# Patient Record
Sex: Male | Born: 1945 | Race: White | Hispanic: No | Marital: Married | Smoking: Former smoker
Health system: Southern US, Community
[De-identification: ages and names within clinical notes are randomized; demographics above are authoritative.]

## PROBLEM LIST (undated history)

## (undated) DIAGNOSIS — N182 Chronic kidney disease, stage 2 (mild): Secondary | ICD-10-CM

## (undated) DIAGNOSIS — I359 Nonrheumatic aortic valve disorder, unspecified: Secondary | ICD-10-CM

## (undated) DIAGNOSIS — I509 Heart failure, unspecified: Secondary | ICD-10-CM

## (undated) DIAGNOSIS — E118 Type 2 diabetes mellitus with unspecified complications: Secondary | ICD-10-CM

## (undated) DIAGNOSIS — I10 Essential (primary) hypertension: Secondary | ICD-10-CM

## (undated) DIAGNOSIS — G4733 Obstructive sleep apnea (adult) (pediatric): Secondary | ICD-10-CM

## (undated) HISTORY — PX: AORTA SURGERY: SHX548

## (undated) HISTORY — DX: Heart failure, unspecified: I50.9

## (undated) HISTORY — PX: CARDIAC SURGERY: SHX584

## (undated) HISTORY — PX: APPENDECTOMY: SHX54

---

## 2015-09-27 ENCOUNTER — Encounter: Payer: Self-pay | Admitting: Emergency Medicine

## 2015-09-27 ENCOUNTER — Emergency Department: Payer: Medicare Other

## 2015-09-27 ENCOUNTER — Emergency Department
Admission: EM | Admit: 2015-09-27 | Discharge: 2015-09-27 | Disposition: A | Discharge status: Home / Self Care | Payer: Medicare Other | Attending: Emergency Medicine | Admitting: Emergency Medicine

## 2015-09-27 DIAGNOSIS — R0600 Dyspnea, unspecified: Secondary | ICD-10-CM | POA: Diagnosis not present

## 2015-09-27 DIAGNOSIS — R0602 Shortness of breath: Secondary | ICD-10-CM | POA: Diagnosis not present

## 2015-09-27 DIAGNOSIS — Z7982 Long term (current) use of aspirin: Secondary | ICD-10-CM | POA: Insufficient documentation

## 2015-09-27 DIAGNOSIS — I10 Essential (primary) hypertension: Secondary | ICD-10-CM | POA: Diagnosis not present

## 2015-09-27 DIAGNOSIS — E875 Hyperkalemia: Secondary | ICD-10-CM | POA: Insufficient documentation

## 2015-09-27 DIAGNOSIS — Z794 Long term (current) use of insulin: Secondary | ICD-10-CM | POA: Insufficient documentation

## 2015-09-27 DIAGNOSIS — E119 Type 2 diabetes mellitus without complications: Secondary | ICD-10-CM | POA: Diagnosis not present

## 2015-09-27 DIAGNOSIS — N289 Disorder of kidney and ureter, unspecified: Secondary | ICD-10-CM | POA: Insufficient documentation

## 2015-09-27 DIAGNOSIS — Z79899 Other long term (current) drug therapy: Secondary | ICD-10-CM | POA: Insufficient documentation

## 2015-09-27 DIAGNOSIS — R42 Dizziness and giddiness: Secondary | ICD-10-CM | POA: Diagnosis present

## 2015-09-27 HISTORY — DX: Essential (primary) hypertension: I10

## 2015-09-27 LAB — CBC WITH DIFFERENTIAL/PLATELET
BASOS ABS: 0.1 10*3/uL (ref 0–0.1)
BASOS PCT: 1 %
Eosinophils Absolute: 0.1 10*3/uL (ref 0–0.7)
Eosinophils Relative: 1 %
HEMATOCRIT: 38.2 % — AB (ref 40.0–52.0)
HEMOGLOBIN: 12.9 g/dL — AB (ref 13.0–18.0)
Lymphocytes Relative: 14 %
Lymphs Abs: 1.2 10*3/uL (ref 1.0–3.6)
MCH: 32.2 pg (ref 26.0–34.0)
MCHC: 33.9 g/dL (ref 32.0–36.0)
MCV: 95 fL (ref 80.0–100.0)
MONOS PCT: 9 %
Monocytes Absolute: 0.8 10*3/uL (ref 0.2–1.0)
NEUTROS ABS: 6.7 10*3/uL — AB (ref 1.4–6.5)
NEUTROS PCT: 75 %
Platelets: 160 10*3/uL (ref 150–440)
RBC: 4.02 MIL/uL — AB (ref 4.40–5.90)
RDW: 16.8 % — ABNORMAL HIGH (ref 11.5–14.5)
WBC: 8.8 10*3/uL (ref 3.8–10.6)

## 2015-09-27 LAB — COMPREHENSIVE METABOLIC PANEL
ALBUMIN: 3.9 g/dL (ref 3.5–5.0)
ALK PHOS: 76 U/L (ref 38–126)
ALT: 28 U/L (ref 17–63)
AST: 33 U/L (ref 15–41)
Anion gap: 9 (ref 5–15)
BILIRUBIN TOTAL: 1 mg/dL (ref 0.3–1.2)
BUN: 34 mg/dL — AB (ref 6–20)
CALCIUM: 8.2 mg/dL — AB (ref 8.9–10.3)
CO2: 20 mmol/L — AB (ref 22–32)
Chloride: 103 mmol/L (ref 101–111)
Creatinine, Ser: 1.85 mg/dL — ABNORMAL HIGH (ref 0.61–1.24)
GFR calc Af Amer: 41 mL/min — ABNORMAL LOW (ref >60)
GFR calc non Af Amer: 35 mL/min — ABNORMAL LOW (ref >60)
GLUCOSE: 308 mg/dL — AB (ref 65–99)
Potassium: 5.8 mmol/L — ABNORMAL HIGH (ref 3.5–5.1)
Sodium: 132 mmol/L — ABNORMAL LOW (ref 135–145)
TOTAL PROTEIN: 7.2 g/dL (ref 6.5–8.1)

## 2015-09-27 LAB — BASIC METABOLIC PANEL
Anion gap: 9 (ref 5–15)
BUN: 32 mg/dL — AB (ref 6–20)
CHLORIDE: 103 mmol/L (ref 101–111)
CO2: 21 mmol/L — ABNORMAL LOW (ref 22–32)
CREATININE: 1.78 mg/dL — AB (ref 0.61–1.24)
Calcium: 8 mg/dL — ABNORMAL LOW (ref 8.9–10.3)
GFR, EST AFRICAN AMERICAN: 43 mL/min — AB (ref >60)
GFR, EST NON AFRICAN AMERICAN: 37 mL/min — AB (ref >60)
Glucose, Bld: 200 mg/dL — ABNORMAL HIGH (ref 65–99)
POTASSIUM: 4.6 mmol/L (ref 3.5–5.1)
SODIUM: 133 mmol/L — AB (ref 135–145)

## 2015-09-27 LAB — URINALYSIS COMPLETE WITH MICROSCOPIC (ARMC ONLY)
BACTERIA UA: NONE SEEN
Bilirubin Urine: NEGATIVE
HGB URINE DIPSTICK: NEGATIVE
Ketones, ur: NEGATIVE mg/dL
Leukocytes, UA: NEGATIVE
Nitrite: NEGATIVE
PROTEIN: 30 mg/dL — AB
RBC / HPF: NONE SEEN RBC/hpf (ref 0–5)
SQUAMOUS EPITHELIAL / LPF: NONE SEEN
Specific Gravity, Urine: 1.013 (ref 1.005–1.030)
pH: 5 (ref 5.0–8.0)

## 2015-09-27 LAB — TROPONIN I: Troponin I: <0.03 ng/mL (ref <0.03)

## 2015-09-27 LAB — GLUCOSE, CAPILLARY: GLUCOSE-CAPILLARY: 187 mg/dL — AB (ref 65–99)

## 2015-09-27 MED ORDER — SODIUM CHLORIDE 0.9 % IV SOLN
Freq: Once | INTRAVENOUS | Status: AC
  Administered 2015-09-27: 16:00:00 via INTRAVENOUS

## 2015-09-27 MED ORDER — DEXTROSE 50 % IV SOLN
25.0000 g | Freq: Once | INTRAVENOUS | Status: DC
  Filled 2015-09-27: qty 50

## 2015-09-27 MED ORDER — DEXTROSE 50 % IV SOLN
12.5000 g | Freq: Once | INTRAVENOUS | Status: AC
  Administered 2015-09-27: 12.5 g via INTRAVENOUS

## 2015-09-27 MED ORDER — INSULIN ASPART 100 UNIT/ML ~~LOC~~ SOLN
10.0000 [IU] | Freq: Once | SUBCUTANEOUS | Status: AC
  Administered 2015-09-27: 10 [IU] via INTRAVENOUS
  Filled 2015-09-27: qty 10

## 2015-09-27 MED ORDER — SODIUM CHLORIDE 0.9 % IV SOLN
1000.0000 mL | Freq: Once | INTRAVENOUS | Status: AC
  Administered 2015-09-27: 1000 mL via INTRAVENOUS

## 2015-09-27 NOTE — ED Notes (Signed)
EMS leaving with patient at this time

## 2015-09-27 NOTE — ED Notes (Signed)
Per [facility], patient comes from home with c/o dizziness. Hx of vertigo. Patient got up around 9am and felt dizzy. He describes this dizziness as "much different" than his usual. Patient normally walks with a cane. EMS states HR was 48, CBG 315. EMS states patient got very SOB when walking and turned blue and pale. Patient A&O x4. HR 51. MD at bedside.

## 2015-09-27 NOTE — ED Provider Notes (Signed)
[facility] Emergency Department Provider Note        Time seen: ----------------------------------------- 2:54 PM on 09/27/2015 -----------------------------------------    I have reviewed the triage vital signs and the nursing notes.   HISTORY  Chief Complaint Dizziness    HPI Taylor Stephenson is a 70 y.o. male who presents to ER complaining of dizziness. Patient reports a history of vertigo but this feels very different. Patient states he feels lightheaded and hit Himself to keep himself from falling. He got up around 9 AM, felt dizzy. Normally he walks with a cane but this did not help him today. EMS arrived to find heart rate was in the 40s blood sugar in the 300s. He also reports some shortness of breath with walking around. He denies recent illness but did have diarrhea today.   Past Medical History  Diagnosis Date  . Hypertension   . Diabetes mellitus without complication (HCC)     There are no active problems to display for this patient.   No past surgical history on file.  Allergies Review of patient's allergies indicates no known allergies.  Social History Social History  Substance Use Topics  . Smoking status: None  . Smokeless tobacco: None  . Alcohol Use: None    Review of Systems Constitutional: Negative for fever. Cardiovascular: Negative for chest pain. Respiratory: Negative for shortness of breath. Gastrointestinal: Negative for abdominal pain, Positive for diarrhea Genitourinary: Negative for dysuria. Musculoskeletal: Negative for back pain. Skin: Negative for rash. Neurological: Negative for headaches, Positive for weakness  10-point ROS otherwise negative.  ____________________________________________   PHYSICAL EXAM:  VITAL SIGNS: ED Triage Vitals  Enc Vitals Group     BP 09/27/15 1432 129/65 mmHg     Pulse Rate 09/27/15 1432 52     Resp 09/27/15 1432 17     Temp 09/27/15 1432 97.9 F (36.6 C)     Temp  Source 09/27/15 1432 Oral     SpO2 09/27/15 1432 99 %     Weight 09/27/15 1432 320 lb (145.151 kg)     Height 09/27/15 1432  (1.702 m)     Head Cir --      Peak Flow --      Pain Score --      Pain Loc --      Pain Edu? --      Excl. in GC? --     Constitutional: Alert and oriented. Well appearing and in no distress. Eyes: Conjunctivae are normal. PERRL. Normal extraocular movements. ENT   Head: Normocephalic and atraumatic.   Nose: No congestion/rhinnorhea.   Mouth/Throat: Mucous membranes are moist.   Neck: No stridor. Cardiovascular: Slow rate, regular rhythm. No murmurs, rubs, or gallops. Respiratory: Normal respiratory effort without tachypnea nor retractions. Breath sounds are clear and equal bilaterally. No wheezes/rales/rhonchi. Gastrointestinal: Soft and nontender. Normal bowel sounds Musculoskeletal: Nontender with normal range of motion in all extremities. Mild edema is noted Neurologic:  Normal speech and language. No gross focal neurologic deficits are appreciated.  Skin:  Skin is warm, dry with chronic-appearing somewhat healing ulcerations on both legs below the knees Psychiatric: Mood and affect are normal. Speech and behavior are normal.  ____________________________________________  EKG: Interpreted by me. Sinus rhythm with first-degree AV block, wide QRS, long QT interval. Right bundle branch block.  ____________________________________________  ED COURSE:  Pertinent labs & imaging results that were available during my care of the patient were reviewed by me and considered in my medical decision  making (see chart for details). Patient presents to ER with dizziness and lightheadedness. He will receive IV fluids. I will check basic labs and reevaluate. ____________________________________________    LABS (pertinent positives/negatives)  Labs Reviewed  CBC WITH DIFFERENTIAL/PLATELET - Abnormal; Notable for the following:    RBC 4.02 (*)     Hemoglobin 12.9 (*)    HCT 38.2 (*)    RDW 16.8 (*)    Neutro Abs 6.7 (*)    All other components within normal limits  COMPREHENSIVE METABOLIC PANEL - Abnormal; Notable for the following:    Sodium 132 (*)    Potassium 5.8 (*)    CO2 20 (*)    Glucose, Bld 308 (*)    BUN 34 (*)    Creatinine, Ser 1.85 (*)    Calcium 8.2 (*)    GFR calc non Af Amer 35 (*)    GFR calc Af Amer 41 (*)    All other components within normal limits  URINALYSIS COMPLETEWITH MICROSCOPIC ([facility] ONLY) - Abnormal; Notable for the following:    Color, Urine YELLOW (*)    APPearance CLEAR (*)    Glucose, UA >500 (*)    Protein, ur 30 (*)    All other components within normal limits  BASIC METABOLIC PANEL - Abnormal; Notable for the following:    Sodium 133 (*)    CO2 21 (*)    Glucose, Bld 200 (*)    BUN 32 (*)    Creatinine, Ser 1.78 (*)    Calcium 8.0 (*)    GFR calc non Af Amer 37 (*)    GFR calc Af Amer 43 (*)    All other components within normal limits  GLUCOSE, CAPILLARY - Abnormal; Notable for the following:    Glucose-Capillary 187 (*)    All other components within normal limits  TROPONIN I    RADIOLOGY Images were viewed by me  Chest x-ray IMPRESSION: No active cardiopulmonary disease. Bilateral lower extremity ultrasound IMPRESSION: No evidence of bilateral lower extremity deep venous thrombosis.  ____________________________________________  FINAL ASSESSMENT AND PLAN  Dizziness, Acute renal insufficiency, hyperkalemia, Dyspnea  Plan: Patient with labs and imaging as dictated above. Patient is had improvement in his renal insufficiency and hyperkalemia after saline. He was also given insulin and a half amp of D50. He does need to be observed to make sure this trend continues. I am also concerned about his dyspnea. Ultrasound was negative for DVT, he will possibly need V/Q scanning or CT angiogram of his chest. Family is agreeable to transfer, he has been except and [facility] by Dr. Fredric MareBailey.   Emily FilbertWilliams, Jonathan E, MD   Note: This dictation was prepared with Dragon dictation. Any transcriptional errors that result from this process are unintentional   Emily FilbertJonathan E Williams, MD 09/27/15 1925

## 2017-07-14 ENCOUNTER — Inpatient Hospital Stay (HOSPITAL_COMMUNITY)
Admit: 2017-07-14 | Discharge: 2017-07-14 | Disposition: A | Discharge status: Home / Self Care | Payer: Medicare Other | Attending: Internal Medicine | Admitting: Internal Medicine

## 2017-07-14 ENCOUNTER — Inpatient Hospital Stay
Admission: EM | Admit: 2017-07-14 | Discharge: 2017-07-18 | DRG: 291 | Disposition: A | Discharge status: Home / Self Care | Payer: Medicare Other | Attending: Internal Medicine | Admitting: Internal Medicine

## 2017-07-14 ENCOUNTER — Emergency Department: Payer: Medicare Other

## 2017-07-14 ENCOUNTER — Other Ambulatory Visit: Payer: Self-pay

## 2017-07-14 ENCOUNTER — Encounter: Payer: Self-pay | Admitting: Emergency Medicine

## 2017-07-14 DIAGNOSIS — R0603 Acute respiratory distress: Secondary | ICD-10-CM | POA: Diagnosis not present

## 2017-07-14 DIAGNOSIS — Z794 Long term (current) use of insulin: Secondary | ICD-10-CM

## 2017-07-14 DIAGNOSIS — E1122 Type 2 diabetes mellitus with diabetic chronic kidney disease: Secondary | ICD-10-CM | POA: Diagnosis present

## 2017-07-14 DIAGNOSIS — Z833 Family history of diabetes mellitus: Secondary | ICD-10-CM

## 2017-07-14 DIAGNOSIS — I509 Heart failure, unspecified: Secondary | ICD-10-CM

## 2017-07-14 DIAGNOSIS — D649 Anemia, unspecified: Secondary | ICD-10-CM | POA: Diagnosis present

## 2017-07-14 DIAGNOSIS — Z79899 Other long term (current) drug therapy: Secondary | ICD-10-CM

## 2017-07-14 DIAGNOSIS — Z953 Presence of xenogenic heart valve: Secondary | ICD-10-CM | POA: Diagnosis not present

## 2017-07-14 DIAGNOSIS — Z87891 Personal history of nicotine dependence: Secondary | ICD-10-CM | POA: Diagnosis not present

## 2017-07-14 DIAGNOSIS — Z91040 Latex allergy status: Secondary | ICD-10-CM | POA: Diagnosis not present

## 2017-07-14 DIAGNOSIS — R601 Generalized edema: Secondary | ICD-10-CM | POA: Diagnosis present

## 2017-07-14 DIAGNOSIS — G4733 Obstructive sleep apnea (adult) (pediatric): Secondary | ICD-10-CM | POA: Diagnosis present

## 2017-07-14 DIAGNOSIS — Z7982 Long term (current) use of aspirin: Secondary | ICD-10-CM | POA: Diagnosis not present

## 2017-07-14 DIAGNOSIS — Z9989 Dependence on other enabling machines and devices: Secondary | ICD-10-CM | POA: Diagnosis not present

## 2017-07-14 DIAGNOSIS — I272 Pulmonary hypertension, unspecified: Secondary | ICD-10-CM | POA: Diagnosis present

## 2017-07-14 DIAGNOSIS — N183 Chronic kidney disease, stage 3 (moderate): Secondary | ICD-10-CM | POA: Diagnosis present

## 2017-07-14 DIAGNOSIS — Z6841 Body Mass Index (BMI) 40.0 and over, adult: Secondary | ICD-10-CM

## 2017-07-14 DIAGNOSIS — J449 Chronic obstructive pulmonary disease, unspecified: Secondary | ICD-10-CM | POA: Diagnosis present

## 2017-07-14 DIAGNOSIS — I2510 Atherosclerotic heart disease of native coronary artery without angina pectoris: Secondary | ICD-10-CM | POA: Diagnosis present

## 2017-07-14 DIAGNOSIS — Z952 Presence of prosthetic heart valve: Secondary | ICD-10-CM | POA: Diagnosis not present

## 2017-07-14 DIAGNOSIS — I130 Hypertensive heart and chronic kidney disease with heart failure and stage 1 through stage 4 chronic kidney disease, or unspecified chronic kidney disease: Principal | ICD-10-CM | POA: Diagnosis present

## 2017-07-14 DIAGNOSIS — I5031 Acute diastolic (congestive) heart failure: Secondary | ICD-10-CM | POA: Diagnosis not present

## 2017-07-14 DIAGNOSIS — R0600 Dyspnea, unspecified: Secondary | ICD-10-CM | POA: Diagnosis not present

## 2017-07-14 DIAGNOSIS — I5033 Acute on chronic diastolic (congestive) heart failure: Secondary | ICD-10-CM | POA: Diagnosis present

## 2017-07-14 DIAGNOSIS — Z841 Family history of disorders of kidney and ureter: Secondary | ICD-10-CM

## 2017-07-14 DIAGNOSIS — I351 Nonrheumatic aortic (valve) insufficiency: Secondary | ICD-10-CM

## 2017-07-14 HISTORY — DX: Nonrheumatic aortic valve disorder, unspecified: I35.9

## 2017-07-14 HISTORY — DX: Morbid (severe) obesity due to excess calories: E66.01

## 2017-07-14 HISTORY — DX: Type 2 diabetes mellitus with unspecified complications: E11.8

## 2017-07-14 HISTORY — DX: Obstructive sleep apnea (adult) (pediatric): G47.33

## 2017-07-14 HISTORY — DX: Chronic kidney disease, stage 2 (mild): N18.2

## 2017-07-14 LAB — CBC WITH DIFFERENTIAL/PLATELET
BASOS ABS: 0 10*3/uL (ref 0–0.1)
BASOS PCT: 1 %
EOS ABS: 0.1 10*3/uL (ref 0–0.7)
Eosinophils Relative: 2 %
HCT: 34.1 % — ABNORMAL LOW (ref 40.0–52.0)
HEMOGLOBIN: 11.1 g/dL — AB (ref 13.0–18.0)
Lymphocytes Relative: 17 %
Lymphs Abs: 1.1 10*3/uL (ref 1.0–3.6)
MCH: 31.7 pg (ref 26.0–34.0)
MCHC: 32.7 g/dL (ref 32.0–36.0)
MCV: 97 fL (ref 80.0–100.0)
MONOS PCT: 11 %
Monocytes Absolute: 0.7 10*3/uL (ref 0.2–1.0)
NEUTROS PCT: 69 %
Neutro Abs: 4.5 10*3/uL (ref 1.4–6.5)
Platelets: 187 10*3/uL (ref 150–440)
RBC: 3.51 MIL/uL — ABNORMAL LOW (ref 4.40–5.90)
RDW: 17.9 % — AB (ref 11.5–14.5)
WBC: 6.4 10*3/uL (ref 3.8–10.6)

## 2017-07-14 LAB — TROPONIN I

## 2017-07-14 LAB — HEMOGLOBIN A1C
HEMOGLOBIN A1C: 7.5 % — AB (ref 4.8–5.6)
Mean Plasma Glucose: 168.55 mg/dL

## 2017-07-14 LAB — BASIC METABOLIC PANEL
Anion gap: 6 (ref 5–15)
BUN: 29 mg/dL — ABNORMAL HIGH (ref 6–20)
CALCIUM: 8.5 mg/dL — AB (ref 8.9–10.3)
CO2: 29 mmol/L (ref 22–32)
CREATININE: 1.4 mg/dL — AB (ref 0.61–1.24)
Chloride: 106 mmol/L (ref 101–111)
GFR calc Af Amer: 56 mL/min — ABNORMAL LOW (ref >60)
GFR, EST NON AFRICAN AMERICAN: 49 mL/min — AB (ref >60)
Glucose, Bld: 167 mg/dL — ABNORMAL HIGH (ref 65–99)
Potassium: 4.9 mmol/L (ref 3.5–5.1)
SODIUM: 141 mmol/L (ref 135–145)

## 2017-07-14 LAB — GLUCOSE, CAPILLARY
Glucose-Capillary: 128 mg/dL — ABNORMAL HIGH (ref 65–99)
Glucose-Capillary: 147 mg/dL — ABNORMAL HIGH (ref 65–99)

## 2017-07-14 LAB — BRAIN NATRIURETIC PEPTIDE: B NATRIURETIC PEPTIDE 5: 519 pg/mL — AB (ref 0.0–100.0)

## 2017-07-14 MED ORDER — RISAQUAD PO CAPS
1.0000 | ORAL_CAPSULE | Freq: Two times a day (BID) | ORAL | Status: DC
  Administered 2017-07-14 – 2017-07-18 (×8): 1 via ORAL
  Filled 2017-07-14 (×9): qty 1

## 2017-07-14 MED ORDER — INSULIN ASPART 100 UNIT/ML ~~LOC~~ SOLN
0.0000 [IU] | Freq: Three times a day (TID) | SUBCUTANEOUS | Status: DC
  Administered 2017-07-15: 1 [IU] via SUBCUTANEOUS
  Administered 2017-07-16: 2 [IU] via SUBCUTANEOUS
  Administered 2017-07-17 (×2): 1 [IU] via SUBCUTANEOUS
  Administered 2017-07-18: 3 [IU] via SUBCUTANEOUS
  Filled 2017-07-14 (×5): qty 1

## 2017-07-14 MED ORDER — ALBUTEROL SULFATE (2.5 MG/3ML) 0.083% IN NEBU: 2.5000 mg | INHALATION_SOLUTION | RESPIRATORY_TRACT | Status: DC | PRN

## 2017-07-14 MED ORDER — INSULIN ASPART 100 UNIT/ML ~~LOC~~ SOLN: 0.0000 [IU] | Freq: Every day | SUBCUTANEOUS | Status: DC

## 2017-07-14 MED ORDER — PERFLUTREN LIPID MICROSPHERE
1.0000 mL | INTRAVENOUS | Status: AC | PRN
  Administered 2017-07-14: 3 mL via INTRAVENOUS
  Filled 2017-07-14: qty 10

## 2017-07-14 MED ORDER — CARVEDILOL 6.25 MG PO TABS
3.1250 mg | ORAL_TABLET | Freq: Two times a day (BID) | ORAL | Status: DC
  Administered 2017-07-14: 3.125 mg via ORAL
  Filled 2017-07-14: qty 1

## 2017-07-14 MED ORDER — HEPARIN SODIUM (PORCINE) 5000 UNIT/ML IJ SOLN
5000.0000 [IU] | Freq: Three times a day (TID) | INTRAMUSCULAR | Status: DC
  Administered 2017-07-14 – 2017-07-18 (×12): 5000 [IU] via SUBCUTANEOUS
  Filled 2017-07-14 (×11): qty 1

## 2017-07-14 MED ORDER — INSULIN ASPART 100 UNIT/ML ~~LOC~~ SOLN
20.0000 [IU] | Freq: Three times a day (TID) | SUBCUTANEOUS | Status: DC | PRN
  Administered 2017-07-15 – 2017-07-17 (×3): 20 [IU] via SUBCUTANEOUS
  Filled 2017-07-14 (×3): qty 1

## 2017-07-14 MED ORDER — SENNOSIDES-DOCUSATE SODIUM 8.6-50 MG PO TABS: 1.0000 | ORAL_TABLET | Freq: Every evening | ORAL | Status: DC | PRN

## 2017-07-14 MED ORDER — ASPIRIN EC 81 MG PO TBEC
81.0000 mg | DELAYED_RELEASE_TABLET | Freq: Every day | ORAL | Status: DC
  Administered 2017-07-15 – 2017-07-18 (×4): 81 mg via ORAL
  Filled 2017-07-14 (×5): qty 1

## 2017-07-14 MED ORDER — ASPIRIN EC 81 MG PO TBEC: 81.0000 mg | DELAYED_RELEASE_TABLET | Freq: Every day | ORAL | Status: DC

## 2017-07-14 MED ORDER — LISINOPRIL 10 MG PO TABS
10.0000 mg | ORAL_TABLET | Freq: Every day | ORAL | Status: DC
  Administered 2017-07-14 – 2017-07-18 (×5): 10 mg via ORAL
  Filled 2017-07-14 (×5): qty 1

## 2017-07-14 MED ORDER — TAMSULOSIN HCL 0.4 MG PO CAPS
0.4000 mg | ORAL_CAPSULE | Freq: Every day | ORAL | Status: DC
  Administered 2017-07-14 – 2017-07-18 (×5): 0.4 mg via ORAL
  Filled 2017-07-14 (×5): qty 1

## 2017-07-14 MED ORDER — SODIUM CHLORIDE 0.9 % IV SOLN
250.0000 mL | INTRAVENOUS | Status: DC | PRN
Start: 2017-07-14 — End: 2017-07-18

## 2017-07-14 MED ORDER — ACETAMINOPHEN 325 MG PO TABS
650.0000 mg | ORAL_TABLET | Freq: Four times a day (QID) | ORAL | Status: DC | PRN
  Administered 2017-07-15 – 2017-07-16 (×2): 650 mg via ORAL
  Filled 2017-07-14 (×2): qty 2

## 2017-07-14 MED ORDER — PANTOPRAZOLE SODIUM 40 MG PO TBEC
40.0000 mg | DELAYED_RELEASE_TABLET | Freq: Every day | ORAL | Status: DC
  Administered 2017-07-15 – 2017-07-18 (×4): 40 mg via ORAL
  Filled 2017-07-14 (×5): qty 1

## 2017-07-14 MED ORDER — SODIUM CHLORIDE 0.9% FLUSH
3.0000 mL | Freq: Two times a day (BID) | INTRAVENOUS | Status: DC
  Administered 2017-07-14 – 2017-07-18 (×9): 3 mL via INTRAVENOUS

## 2017-07-14 MED ORDER — ATORVASTATIN CALCIUM 20 MG PO TABS
40.0000 mg | ORAL_TABLET | Freq: Every day | ORAL | Status: DC
  Administered 2017-07-14 – 2017-07-17 (×4): 40 mg via ORAL
  Filled 2017-07-14 (×4): qty 2

## 2017-07-14 MED ORDER — SERTRALINE HCL 50 MG PO TABS
50.0000 mg | ORAL_TABLET | Freq: Every day | ORAL | Status: DC
  Administered 2017-07-14 – 2017-07-18 (×5): 50 mg via ORAL
  Filled 2017-07-14 (×5): qty 1

## 2017-07-14 MED ORDER — ACETAMINOPHEN 650 MG RE SUPP: 650.0000 mg | Freq: Four times a day (QID) | RECTAL | Status: DC | PRN

## 2017-07-14 MED ORDER — INSULIN GLARGINE 100 UNIT/ML ~~LOC~~ SOLN
50.0000 [IU] | Freq: Two times a day (BID) | SUBCUTANEOUS | Status: DC
  Administered 2017-07-14 – 2017-07-18 (×8): 50 [IU] via SUBCUTANEOUS
  Filled 2017-07-14 (×12): qty 0.5

## 2017-07-14 MED ORDER — HYDROCODONE-ACETAMINOPHEN 5-325 MG PO TABS: 1.0000 | ORAL_TABLET | ORAL | Status: DC | PRN

## 2017-07-14 MED ORDER — FUROSEMIDE 10 MG/ML IJ SOLN
60.0000 mg | Freq: Two times a day (BID) | INTRAMUSCULAR | Status: DC
  Administered 2017-07-14 – 2017-07-15 (×2): 60 mg via INTRAVENOUS
  Filled 2017-07-14 (×2): qty 6

## 2017-07-14 MED ORDER — ONDANSETRON HCL 4 MG/2ML IJ SOLN: 4.0000 mg | Freq: Four times a day (QID) | INTRAMUSCULAR | Status: DC | PRN

## 2017-07-14 MED ORDER — FUROSEMIDE 10 MG/ML IJ SOLN
80.0000 mg | Freq: Once | INTRAMUSCULAR | Status: AC
  Administered 2017-07-14: 80 mg via INTRAVENOUS
  Filled 2017-07-14: qty 8

## 2017-07-14 MED ORDER — SODIUM CHLORIDE 0.9% FLUSH
3.0000 mL | INTRAVENOUS | Status: DC | PRN
  Administered 2017-07-15 – 2017-07-16 (×2): 3 mL via INTRAVENOUS
  Filled 2017-07-14 (×2): qty 3

## 2017-07-14 MED ORDER — BISACODYL 5 MG PO TBEC: 5.0000 mg | DELAYED_RELEASE_TABLET | Freq: Every day | ORAL | Status: DC | PRN

## 2017-07-14 MED ORDER — ONDANSETRON HCL 4 MG PO TABS: 4.0000 mg | ORAL_TABLET | Freq: Four times a day (QID) | ORAL | Status: DC | PRN

## 2017-07-14 NOTE — ED Notes (Signed)
Wife (tam) contacted at [phone] to inform her of pt transfer to [facility].

## 2017-07-14 NOTE — Progress Notes (Signed)
Patient will have wife take home his medications.

## 2017-07-14 NOTE — ED Notes (Signed)
Pt given diet [place] sandwich tray by RN

## 2017-07-14 NOTE — ED Provider Notes (Signed)
[facility] Emergency Department Provider Note       Time seen: ----------------------------------------- 7:56 AM on 07/14/2017 -----------------------------------------   I have reviewed the triage vital signs and the nursing notes.  HISTORY   Chief Complaint Shortness of Breath    HPI Taylor Stephenson is a 72 y.o. male with a history of diabetes and hypertension who presents to the ED for shortness of breath.  Patient reports shortness of breath and weight gain over the past several days.  Patient describes abdominal distention and peripheral edema.  He feels like he has gained perhaps 20 pounds recently but he cannot give an exact start date.  He denies fevers, chills, chest pain but does have shortness of breath.  He denies vomiting or diarrhea.  Patient did receive DuoNeb's and steroids in route with some improvement in his symptoms.  Past Medical History:  Diagnosis Date  . Diabetes mellitus without complication (HCC)   . Hypertension   COPD, pulmonary hypertension  There are no active problems to display for this patient.   Past Surgical History:  Procedure Laterality Date  . AORTA SURGERY    . APPENDECTOMY      Allergies Patient has no known allergies.  Social History Social History   Tobacco Use  . Smoking status: Not on file  Substance Use Topics  . Alcohol use: Not on file  . Drug use: Not on file   Review of Systems Constitutional: Negative for fever. Cardiovascular: Negative for chest pain. Respiratory: Positive for shortness of breath Gastrointestinal: Negative for abdominal pain, vomiting and diarrhea. Musculoskeletal: Negative for back pain.  Positive for peripheral edema Skin: Negative for rash. Neurological: Negative for headaches, focal weakness or numbness.  All systems negative/normal/unremarkable except as stated in the HPI  ____________________________________________   PHYSICAL EXAM:  VITAL SIGNS: ED  Triage Vitals  Enc Vitals Group     BP      Pulse      Resp      Temp      Temp src      SpO2      Weight      Height      Head Circumference      Peak Flow      Pain Score      Pain Loc      Pain Edu?      Excl. in GC?    Constitutional: Alert and oriented.  Mild distress, morbidly obese Eyes: Conjunctivae are normal. Normal extraocular movements. ENT   Head: Normocephalic and atraumatic.   Nose: No congestion/rhinnorhea.   Mouth/Throat: Mucous membranes are moist.   Neck: No stridor. Cardiovascular: Normal rate, regular rhythm. No murmurs, rubs, or gallops. Respiratory: Normal respiratory effort without tachypnea nor retractions. Breath sounds are clear and equal bilaterally. No wheezes/rales/rhonchi. Gastrointestinal: Soft and nontender. Normal bowel sounds Musculoskeletal: Nontender with normal range of motion in extremities.  Generalized edema is noted Neurologic:  Normal speech and language. No gross focal neurologic deficits are appreciated.  Skin:  Skin is warm, dry and intact.  Scattered contusions are appreciated over the trunk and extremities Psychiatric: Mood and affect are normal. Speech and behavior are normal.  ____________________________________________  EKG: Interpreted by me.  Sinus rhythm rate 65 bpm, right bundle branch block pattern, normal PR interval, borderline long QT  ____________________________________________  ED COURSE:  As part of my medical decision making, I reviewed the following data within the electronic MEDICAL RECORD NUMBER History obtained from family if available, nursing  notes, old chart and ekg, as well as notes from prior ED visits. Patient presented for shortness of breath, we will assess with labs and imaging as indicated at this time.   Procedures ____________________________________________   LABS (pertinent positives/negatives)  Labs Reviewed  CBC WITH DIFFERENTIAL/PLATELET - Abnormal; Notable for the following  components:      Result Value   RBC 3.51 (*)    Hemoglobin 11.1 (*)    HCT 34.1 (*)    RDW 17.9 (*)    All other components within normal limits  BASIC METABOLIC PANEL - Abnormal; Notable for the following components:   Glucose, Bld 167 (*)    BUN 29 (*)    Creatinine, Ser 1.40 (*)    Calcium 8.5 (*)    GFR calc non Af Amer 49 (*)    GFR calc Af Amer 56 (*)    All other components within normal limits  BRAIN NATRIURETIC PEPTIDE - Abnormal; Notable for the following components:   B Natriuretic Peptide 519.0 (*)    All other components within normal limits  TROPONIN I    RADIOLOGY  Chest x-ray IMPRESSION: Stable postoperative findings.  Stable cardiomegaly with vascular congestion  No interval change. ____________________________________________  DIFFERENTIAL DIAGNOSIS   CHF, COPD, pneumonia, pulmonary hypertension, anasarca  FINAL ASSESSMENT AND PLAN  Dyspnea, likely CHF exacerbation  Plan: The patient had presented for worsening dyspnea, especially on exertion. Patient's labs revealed elevated BNP but otherwise unremarkable lab work with mild chronic renal insufficiency. Patient's imaging revealed cardiomegaly with vascular congestion.  Patient describes a 20 pound weight gain, he will benefit from aggressive diuresis.  Have started him on IV Lasix.  I will discuss with the hospitalist for admission.   Ulice Dash, MD   Note: This note was generated in part or whole with voice recognition software. Voice recognition is usually quite accurate but there are transcription errors that can and very often do occur. I apologize for any typographical errors that were not detected and corrected.     Emily Filbert, MD 07/14/17 [phone]

## 2017-07-14 NOTE — H&P (Addendum)
[facility] [place] [facility]   PATIENT NAME: Taylor Stephenson    MR#:  [id number]  DATE OF BIRTH:  Feb 19, 1946  DATE OF ADMISSION:  07/14/2017  PRIMARY CARE PHYSICIAN: [facility]   REQUESTING/REFERRING PHYSICIAN: Dr. Mayford Knife.  CHIEF COMPLAINT:   Chief Complaint  Patient presents with  . Shortness of Breath   Worsening shortness of breath for 1 week. HISTORY OF PRESENT ILLNESS:  Taylor Stephenson  is a 72 y.o. male with a known history of hypertension, diabetes, OSA, CKD and aortic wall disease.  The patient presented to ED with above chief complaints.  He also complains of shortness of breath on mild exertion, orthopnea, nocturnal dyspnea, worsening leg edema and weight gain.  Chest x-ray show pulmonary congestion.  PAST MEDICAL HISTORY:   Past Medical History:  Diagnosis Date  . Aortic valve disease   . CKD (chronic kidney disease)   . Diabetes mellitus without complication (HCC)   . Hypertension   . OSA (obstructive sleep apnea)   . Pulmonary hypertension (HCC)     PAST SURGICAL HISTORY:   Past Surgical History:  Procedure Laterality Date  . AORTA SURGERY    . APPENDECTOMY    . CARDIAC SURGERY     heart valve replaced    SOCIAL HISTORY:   Social History   Tobacco Use  . Smoking status: Former Smoker    Types: Cigarettes  . Smokeless tobacco: Never Used  Substance Use Topics  . Alcohol use: Not on file    FAMILY HISTORY:   Family History  Problem Relation Age of Onset  . Diabetes Mother   . Diabetes Father   . Kidney disease Father   . Diabetes Sister     DRUG ALLERGIES:   Allergies  Allergen Reactions  . Latex     REVIEW OF SYSTEMS:   Review of Systems  Constitutional: Positive for malaise/fatigue. Negative for chills and fever.  HENT: Negative for sore throat.   Eyes: Negative for blurred vision and double vision.  Respiratory: Positive for cough, sputum production and shortness of breath. Negative  for hemoptysis, wheezing and stridor.   Cardiovascular: Positive for orthopnea and leg swelling. Negative for chest pain and palpitations.  Gastrointestinal: Negative for abdominal pain, blood in stool, diarrhea, melena, nausea and vomiting.  Genitourinary: Negative for dysuria, flank pain and hematuria.  Musculoskeletal: Negative for back pain and joint pain.  Neurological: Negative for dizziness, sensory change, focal weakness, seizures, loss of consciousness, weakness and headaches.  Endo/Heme/Allergies: Negative for polydipsia.  Psychiatric/Behavioral: Negative for depression. The patient is not nervous/anxious.     MEDICATIONS AT HOME:   Prior to Admission medications   Medication Sig Start Date End Date Taking? Authorizing Provider  acetaminophen (TYLENOL) 500 MG tablet Take 500-1,000 mg by mouth every 6 (six) hours as needed.    [provider]  aspirin EC 81 MG tablet Take 81 mg by mouth daily.    [provider]  insulin glargine (LANTUS) 100 UNIT/ML injection Inject 60 Units into the skin 2 (two) times daily.    [provider]  insulin regular (NOVOLIN R,HUMULIN R) 100 units/mL injection Inject 20 Units into the skin 3 (three) times daily before meals. If blood sugar is over 125.    [provider]  LISINOPRIL PO Take 1 tablet by mouth daily.    [provider]  SERTRALINE HCL PO Take 0.5 tablets by mouth daily.    [provider]  Sildenafil  Citrate (VIAGRA PO) Take 0.5 tablets by mouth 3 (three) times daily.    [provider]  SIMVASTATIN PO Take 0.5 tablets by mouth daily.    [provider]      VITAL SIGNS:  Blood pressure (!) 150/64, pulse 64, temperature 97.9 F (36.6 C), temperature source Oral, resp. rate 15, height  (1.702 m), weight (!) 338 lb (153.3 kg), SpO2 97 %.  PHYSICAL EXAMINATION:  Physical Exam  GENERAL:  72 y.o.-year-old patient lying in the bed with no acute distress.  Morbid  obesity. EYES: Pupils equal, round, reactive to light and accommodation. No scleral icterus. Extraocular muscles intact.  HEENT: Head atraumatic, normocephalic. Oropharynx and nasopharynx clear.  NECK:  Supple, no jugular venous distention. No thyroid enlargement, no tenderness.  LUNGS: No wheezing, bilateral rales,rhonchi or crepitation. No use of accessory muscles of respiration.  CARDIOVASCULAR: S1, S2 normal. No murmurs, rubs, or gallops.  ABDOMEN: Soft, nontender, distended. Bowel sounds present.  Unable to estimate organomegaly or mass.  EXTREMITIES: Bilateral leg and foot edema, no cyanosis, or clubbing.  NEUROLOGIC: Cranial nerves II through XII are intact. Muscle strength 5/5 in all extremities. Sensation intact. Gait not checked.  PSYCHIATRIC: The patient is alert and oriented x 3.  SKIN: No obvious rash, lesion, or ulcer.   LABORATORY PANEL:   CBC Recent Labs  Lab 07/14/17 0800  WBC 6.4  HGB 11.1*  HCT 34.1*  PLT 187   ------------------------------------------------------------------------------------------------------------------  Chemistries  Recent Labs  Lab 07/14/17 0800  NA 141  K 4.9  CL 106  CO2 29  GLUCOSE 167*  BUN 29*  CREATININE 1.40*  CALCIUM 8.5*   ------------------------------------------------------------------------------------------------------------------  Cardiac Enzymes Recent Labs  Lab 07/14/17 0800  TROPONINI <0.03   ------------------------------------------------------------------------------------------------------------------  RADIOLOGY:  Dg Chest 2 View  Result Date: 07/14/2017 CLINICAL DATA:  Shortness of breath, diabetes, hypertension EXAM: CHEST - 2 VIEW COMPARISON:  09/27/2015 FINDINGS: Previous median sternotomy. Aortic valve replacement noted. Stable cardiomegaly with vascular congestion. No superimposed pneumonia, collapse or consolidation. Negative for edema, effusion or pneumothorax. Trachea is midline. Aorta is  atherosclerotic. Degenerative changes of the spine. IMPRESSION: Stable postoperative findings. Stable cardiomegaly with vascular congestion No interval change. Electronically Signed   By: Judie Petit.  Shick M.D.   On: 07/14/2017 09:15      IMPRESSION AND PLAN:   Acute CHF.  Unclear systolic or diastolic. The patient will be admitted to telemetry floor. Start Lasix 60 mg IV every 12 hours, echocardiograph, cardiology consult.  CKD stage III, Stable, follow-up BMP while on Lasix.  Hypertension continue home hypertension medication.  OSA.  Continue home CPAP setting.  Diabetes.  Start sliding scale and continue home Lantus.   All the records are reviewed and case discussed with ED provider. Management plans discussed with the patient, his wife and they are in agreement.  CODE STATUS: Full code  TOTAL TIME TAKING CARE OF THIS PATIENT: 52 minutes.    Shaune Pollack M.D on 07/14/2017 at 10:50 AM  Between 7am to 6pm - Pager - [phone]  After 6pm go to [email or web address] - Social research officer, government  [facility] [place] [facility]  Office  [phone]  CC: Primary care physician; [facility]   Note: This dictation was prepared with Dragon dictation along with smaller phrase technology. Any transcriptional errors that result from this process are unin

## 2017-07-14 NOTE — ED Triage Notes (Signed)
C/O worsening SOB over past several days.  Patient reports symptoms worsening over the past couple of days and this morning.  Patient reports recent rapid weight gain.  States normal weight is just under 300 lbs, but thinks he is around 320 lbs today.  On EMS arrival RA sats 93%.  Received one duoneb and one albuterol treatment by EMS

## 2017-07-14 NOTE — ED Notes (Signed)
Hospitalist to bedside at this time 

## 2017-07-14 NOTE — Progress Notes (Signed)
Advanced Care Plan.  Purpose of Encounter: CODE STATUS. Parties in Attendance: The patient, his wife and me. Patient's Decisional Capacity: Yes. Medical Story: Taylor Stephenson  is a 73 y.o. male with a known history of hypertension, diabetes, OSA, CKD and aortic wall disease.  He has had worsening shortness of breath and leg edema for 1 week.  He is being admitted for acute CHF.  I discussed the patient's current condition, prognosis and CODE STATUS.  The patient want full code.  Plan:  Code Status: Full code Time spent discussing advance care planning: 18 minutes.

## 2017-07-14 NOTE — ED Notes (Signed)
1275 mL urine emptied from cath bag before pt transport

## 2017-07-15 ENCOUNTER — Encounter: Payer: Self-pay | Admitting: Physician Assistant

## 2017-07-15 DIAGNOSIS — R601 Generalized edema: Secondary | ICD-10-CM

## 2017-07-15 DIAGNOSIS — R0603 Acute respiratory distress: Secondary | ICD-10-CM

## 2017-07-15 DIAGNOSIS — I5033 Acute on chronic diastolic (congestive) heart failure: Secondary | ICD-10-CM

## 2017-07-15 DIAGNOSIS — R0600 Dyspnea, unspecified: Secondary | ICD-10-CM

## 2017-07-15 LAB — BASIC METABOLIC PANEL
ANION GAP: 7 (ref 5–15)
BUN: 26 mg/dL — ABNORMAL HIGH (ref 6–20)
CALCIUM: 8.3 mg/dL — AB (ref 8.9–10.3)
CO2: 29 mmol/L (ref 22–32)
Chloride: 102 mmol/L (ref 101–111)
Creatinine, Ser: 1.56 mg/dL — ABNORMAL HIGH (ref 0.61–1.24)
GFR calc Af Amer: 49 mL/min — ABNORMAL LOW (ref >60)
GFR, EST NON AFRICAN AMERICAN: 43 mL/min — AB (ref >60)
Glucose, Bld: 155 mg/dL — ABNORMAL HIGH (ref 65–99)
POTASSIUM: 3.7 mmol/L (ref 3.5–5.1)
SODIUM: 138 mmol/L (ref 135–145)

## 2017-07-15 LAB — CBC
HCT: 32.2 % — ABNORMAL LOW (ref 40.0–52.0)
HEMOGLOBIN: 10.6 g/dL — AB (ref 13.0–18.0)
MCH: 31.4 pg (ref 26.0–34.0)
MCHC: 33.1 g/dL (ref 32.0–36.0)
MCV: 95.1 fL (ref 80.0–100.0)
Platelets: 180 10*3/uL (ref 150–440)
RBC: 3.39 MIL/uL — ABNORMAL LOW (ref 4.40–5.90)
RDW: 16.9 % — AB (ref 11.5–14.5)
WBC: 6 10*3/uL (ref 3.8–10.6)

## 2017-07-15 LAB — GLUCOSE, CAPILLARY
GLUCOSE-CAPILLARY: 136 mg/dL — AB (ref 65–99)
Glucose-Capillary: 111 mg/dL — ABNORMAL HIGH (ref 65–99)
Glucose-Capillary: 106 mg/dL — ABNORMAL HIGH (ref 65–99)
Glucose-Capillary: 182 mg/dL — ABNORMAL HIGH (ref 65–99)

## 2017-07-15 LAB — TROPONIN I
TROPONIN I: 0.03 ng/mL — AB (ref <0.03)
TROPONIN I: 0.03 ng/mL — AB (ref <0.03)

## 2017-07-15 LAB — MAGNESIUM: MAGNESIUM: 1.5 mg/dL — AB (ref 1.7–2.4)

## 2017-07-15 LAB — ECHOCARDIOGRAM COMPLETE
Height: 67 in
WEIGHTICAEL: 5408 [oz_av]

## 2017-07-15 MED ORDER — POTASSIUM CHLORIDE CRYS ER 20 MEQ PO TBCR
20.0000 meq | EXTENDED_RELEASE_TABLET | Freq: Every day | ORAL | Status: DC
  Administered 2017-07-15 – 2017-07-18 (×4): 20 meq via ORAL
  Filled 2017-07-15 (×4): qty 1

## 2017-07-15 MED ORDER — CARVEDILOL 6.25 MG PO TABS
6.2500 mg | ORAL_TABLET | Freq: Two times a day (BID) | ORAL | Status: DC
  Administered 2017-07-15 – 2017-07-18 (×6): 6.25 mg via ORAL
  Filled 2017-07-15 (×6): qty 1

## 2017-07-15 MED ORDER — FUROSEMIDE 10 MG/ML IJ SOLN
40.0000 mg | Freq: Two times a day (BID) | INTRAMUSCULAR | Status: DC
  Administered 2017-07-15 – 2017-07-18 (×6): 40 mg via INTRAVENOUS
  Filled 2017-07-15 (×6): qty 4

## 2017-07-15 MED ORDER — MAGNESIUM SULFATE 2 GM/50ML IV SOLN
2.0000 g | Freq: Once | INTRAVENOUS | Status: AC
  Administered 2017-07-15: 2 g via INTRAVENOUS
  Filled 2017-07-15: qty 50

## 2017-07-15 NOTE — Progress Notes (Signed)
Patient has severe MASD in his abdominal folds and groin folds.  His unable to lift his abdomin to clean in the area.  Site looks and smells like yeast.  Cleansed, dried, Nystatin powder applied. Will instruct wife on care of this site when she returns.

## 2017-07-15 NOTE — Consult Note (Signed)
Cardiology Consultation:   Patient ID: Taylor Stephenson; [id number]; 1945-07-31   Admit date: 07/14/2017 Date of Consult: 07/15/2017  Primary Care Provider: [facility], [facility] Primary Cardiologist: [facility]   Patient Profile:   Taylor Stephenson is a 72 y.o. 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 Imogene Stephenson.  History of Present Illness:   Taylor Stephenson is followed by the [facility]. He reports undergoing bioprosthetic AVR in 2014 with a LHC prior to that showing nonobstructive disease. We do not have any prior cardiac records for him for review. He reports recently being admitted to the [facility] in [place] 2 months prior for similar symptoms. He was diuresed for 2-3 days and discharged. He reports a prior echo, though does not know the results. His home cardiac medications include Lasix 20 mg daily, Coreg 12.5 mg bid, lisinopril 30 mg daily, Lipitor 40 mg daily, and ASA 81 mg daily. He has been compliant with his medications, though did not begin to watch his salt intake until ~ 1 to 1.5 months prior at which time he saw a dietitian. He drinks large amounts of green tea. He does not weigh himself at home. He reports a dry weight of ~ 290-300 pounds, though we do not know how accurate this is.   Over the past 2 months he has noted worsening lower extremity swelling, abdominal distension, and increased SOB. He reports intermittent SOB since his AVR, though over the past couple of months this has been worse. He reports the development of these symptoms shortly after [facility]. No chest pain, dizziness, palpitations, diaphoresis, nausea, vomiting, presyncope, or syncope. He denies any early satiety. He sleeps in a  recliner, though not because of dyspnea. His wife has to sleep in a recliner and he wants to be near her. Because of his worsening SOB and swelling he presented to [facility].  Upon his arrival to [facility] she was noted to have a BP of 158/62, HR 70 bpm, temperature 97.9, oxygen saturation 95% on room air, weight 338 pounds. CXR showed stable cardiomegaly with vascular congestion along with a prior median sternotomy with AVR. EKG as below. Labs showed an initial troponin of 0.03, BNP 519, SCr 1.40 (prior 1.7-1.8), K+ 4.9, glucose 167, WBC 6.4, HGB 11.1, PLT 187. He was given IV Lasix 80 mg x 1 in the ED with a documented UOP of 975 mL. Upon admission, he was place on IV Lasix 60 mg bid and cardiology was asked to evaluate. Overnight, he has diuresed 4.7 L. Weight 338-->321 pounds. Renal function with a slight bump this morning from 1.4-->1.56, K+ 4.9-->3.7, troponin was minimally elevated and flat trending at 0.03, HGB low, though stable at 10.6, magnesium 1.5, A1c 7.5. Currently, is less SOB, though still feels like his abdomen and lower extremities are swollen.    Past Medical History:  Diagnosis Date  . Aortic valve disease    a. s/p bioprosthetic AVR in ~ 2014  . CKD (chronic kidney disease), stage II   . Diabetes mellitus with complication (HCC)   . Hypertension   . Morbid obesity (HCC)   . OSA (obstructive sleep apnea)    a. on CPAP  Past Surgical History:  Procedure Laterality Date  . AORTA SURGERY    . APPENDECTOMY    . CARDIAC SURGERY     heart valve replaced     Home Meds: Prior to Admission medications   Medication Sig Start Date End Date Taking? Authorizing Provider  acidophilus (RISAQUAD) CAPS capsule Take 1 capsule by mouth 2 (two) times daily.   Yes [provider]  albuterol (PROVENTIL HFA;VENTOLIN HFA) 108 (90 Base) MCG/ACT inhaler Inhale 2 puffs into the lungs every 6 (six) hours as needed for wheezing or shortness of breath.   Yes [provider]  ammonium  lactate (LAC-HYDRIN) 12 % lotion Apply 1 application topically as needed for dry skin.   Yes [provider]  aspirin EC 81 MG tablet Take 81 mg by mouth daily.   Yes [provider]  atorvastatin (LIPITOR) 40 MG tablet Take 40 mg by mouth at bedtime.   Yes [provider]  carvedilol (COREG) 12.5 MG tablet Take 12.5 mg by mouth 2 (two) times daily with a meal.   Yes [provider]  clotrimazole (LOTRIMIN) 1 % external solution Apply 1 application topically 2 (two) times daily.   Yes [provider]  furosemide (LASIX) 20 MG tablet Take 20 mg by mouth.   Yes [provider]  glucagon (GLUCAGON EMERGENCY) 1 MG injection Inject 1 mg into the vein once as needed (emergency hypoglycemia).   Yes [provider]  insulin glargine (LANTUS) 100 UNIT/ML injection Inject 50 Units into the skin 2 (two) times daily.    Yes [provider]  insulin regular (NOVOLIN R,HUMULIN R) 100 units/mL injection Inject 20 Units into the skin 3 (three) times daily before meals. If blood sugar is over 125.   Yes [provider]  ketoconazole (NIZORAL) 2 % shampoo Apply 1 application topically daily.   Yes [provider]  lisinopril (PRINIVIL,ZESTRIL) 30 MG tablet Take 1 tablet by mouth daily.   Yes [provider]  omeprazole (PRILOSEC) 20 MG capsule Take 20 mg by mouth daily.   Yes [provider]  sertraline (ZOLOFT) 50 MG tablet Take 1 tablet by mouth daily.    Yes [provider]  tamsulosin (FLOMAX) 0.4 MG CAPS capsule Take 0.4 mg by mouth daily.   Yes [provider]  Tiotropium Bromide-Olodaterol 2.5-2.5 MCG/ACT AERS Inhale 2 puffs into the lungs daily.   Yes [provider]  acetaminophen (TYLENOL) 500 MG tablet Take 500-1,000 mg by mouth every 6 (six) hours as needed.    [provider]    Inpatient Medications: Scheduled Meds: . acidophilus  1 capsule Oral BID  .  aspirin EC  81 mg Oral Daily  . atorvastatin  40 mg Oral QHS  . carvedilol  3.125 mg Oral BID WC  . furosemide  60 mg Intravenous Q12H  . heparin  5,000 Units Subcutaneous Q8H  . insulin aspart  0-5 Units Subcutaneous QHS  . insulin aspart  0-9 Units Subcutaneous TID WC  . insulin glargine  50 Units Subcutaneous BID  . lisinopril  10 mg Oral Daily  . pantoprazole  40 mg Oral Daily  . sertraline  50 mg Oral Daily  . sodium chloride flush  3 mL Intravenous Q12H  . tamsulosin  0.4 mg Oral Daily   Continuous Infusions: . sodium chloride     PRN Meds: sodium chloride, acetaminophen **OR** acetaminophen, albuterol, bisacodyl, HYDROcodone-acetaminophen, insulin aspart, ondansetron **OR** ondansetron (ZOFRAN) IV, senna-docusate, sodium chloride flush  Allergies:   Allergies  Allergen Reactions  . Latex     Social History:   Social History   Socioeconomic History  . Marital status: Married    Spouse name: Not on file  . Number of children: Not on file  . Years of education: Not on file  . Highest education level: Not on file  Occupational History  . Not on file  Social Needs  . Financial resource strain: Not on file  . Food insecurity:    Worry: Not on file    Inability: Not on file  . Transportation needs:    Medical: Not on file    Non-medical: Not on file  Tobacco Use  . Smoking status: Former Smoker    Types: Cigarettes  . Smokeless tobacco: Never Used  Substance and Sexual Activity  . Alcohol use: Not on file  . Drug use: Not on file  . Sexual activity: Not on file  Lifestyle  . Physical activity:    Days per week: Not on file    Minutes per session: Not on file  . Stress: Not on file  Relationships  . Social connections:    Talks on phone: Not on file    Gets together: Not on file    Attends religious service: Not on file    Active member of club or organization: Not on file    Attends meetings of clubs or organizations: Not on file    Relationship  status: Not on file  . Intimate partner violence:    Fear of current or ex partner: Not on file    Emotionally abused: Not on file    Physically abused: Not on file    Forced sexual activity: Not on file  Other Topics Concern  . Not on file  Social History Narrative  . Not on file     Family History:   Family History  Problem Relation Age of Onset  . Diabetes Mother   . Diabetes Father   . Kidney disease Father   . Diabetes Sister     ROS:  Review of Systems  Constitutional: Positive for malaise/fatigue. Negative for chills, diaphoresis, fever and weight loss.  HENT: Negative for congestion.   Eyes: Negative for discharge and redness.  Respiratory: Positive for shortness of breath. Negative for cough, hemoptysis, sputum production and wheezing.   Cardiovascular: Positive for leg swelling. Negative for chest pain, palpitations, orthopnea, claudication and PND.  Gastrointestinal: Negative for abdominal pain, blood in stool, heartburn, melena, nausea and vomiting.  Genitourinary: Negative for hematuria.  Musculoskeletal: Negative for falls and myalgias.  Skin: Negative for rash.  Neurological: Positive for weakness. Negative for dizziness, tingling, tremors, sensory change, speech change, focal weakness and loss of consciousness.  Endo/Heme/Allergies: Does not bruise/bleed easily.  Psychiatric/Behavioral: Negative for substance abuse. The patient is not nervous/anxious.   All other systems reviewed and are negative.     Physical Exam/Data:   Vitals:   07/14/17 2006 07/14/17 2143 07/15/17 0355 07/15/17 0734  BP: (!) 150/61  (!) 150/60 (!) 134/48  Pulse: 68 69 71 70  Resp: Temp: 98.6 F (37 C)  98.6 F (37 C)   TempSrc: Oral  Oral   SpO2: 95% 94% 94% 95%  Weight:   (!) 321 lb (145.6 kg)   Height:        Intake/Output Summary (Last 24 hours) at 07/15/2017 0759 Last data filed at 07/15/2017 4098 Gross per 24 hour  Intake -  Output 4775 ml  Net -4775 ml    Filed Weights   07/14/17 0757 07/15/17 0355  Weight: (!) 338 lb (153.3 kg) (!) 321 lb (145.6 kg)   Body mass index is 50.28 kg/m.   Physical Exam: General: Well developed, well nourished, in no acute distress. Head: Normocephalic, atraumatic, sclera non-icteric, no xanthomas, nares without discharge.  Neck: Negative for carotid bruits. JVD difficult to assess secondary to body habitus. Lungs: Clear bilaterally to auscultation without wheezes, rales, or rhonchi. Breathing is unlabored. Heart: RRR with S1 S2. II/VI systolic murmur RUSB, no rubs, or gallops appreciated. Abdomen: Morbidly obese, soft, non-tender, mildly distended with normoactive bowel sounds. No hepatomegaly. No rebound/guarding. No obvious abdominal masses. Msk:  Strength and tone appear normal for age. Extremities: No clubbing or cyanosis. 2+ bilateral lower extremity pitting edema to the thighs with changes consistent with chronic swelling. Distal pedal pulses are 2+ and equal bilaterally. Neuro: Alert and oriented X 3. No facial asymmetry. No focal deficit. Moves all extremities spontaneously. Psych:  Responds to questions appropriately with a normal affect.   EKG:  The EKG was personally reviewed and demonstrates: NSR, 65 bpm, 1st degree AV block, RBBB (no prior to compare to) Telemetry:  Telemetry was personally reviewed and demonstrates: NSR with 1st degree AV block  Weights: Filed Weights   07/14/17 0757 07/15/17 0355  Weight: (!) 338 lb (153.3 kg) (!) 321 lb (145.6 kg)    Relevant CV Studies: TTE pending  Laboratory Data:  Chemistry Recent Labs  Lab 07/14/17 0800 07/15/17 0424  NA 141 138  K 4.9 3.7  CL 106 102  CO2 29 29  GLUCOSE 167* 155*  BUN 29* 26*  CREATININE 1.40* 1.56*  CALCIUM 8.5* 8.3*  GFRNONAA 49* 43*  GFRAA 56* 49*  ANIONGAP 6 7    No results for input(s): PROT, ALBUMIN, AST, ALT, ALKPHOS, BILITOT in the last 168 hours. Hematology Recent Labs  Lab 07/14/17 0800  07/15/17 0424  WBC 6.4 6.0  RBC 3.51* 3.39*  HGB 11.1* 10.6*  HCT 34.1* 32.2*  MCV 97.0 95.1  MCH 31.7 31.4  MCHC 32.7 33.1  RDW 17.9* 16.9*  PLT 187 180   Cardiac Enzymes Recent Labs  Lab 07/14/17 0800 07/14/17 1628 07/14/17 2320 07/15/17 0424  TROPONINI <0.03 <0.03 0.03* 0.03*   No results for input(s): TROPIPOC in the last 168 hours.  BNP Recent Labs  Lab 07/14/17 0756  BNP 519.0*    DDimer No results for input(s): DDIMER in the last 168 hours.  Radiology/Studies:  Dg Chest 2 View  Result Date: 07/14/2017 IMPRESSION: Stable postoperative findings. Stable cardiomegaly with vascular congestion No interval change. Electronically Signed   By: Judie Petit.  Shick M.D.   On: 07/14/2017 09:15    Assessment and Plan:   1. Acute respiratory distress with hypoxia: -Likely multifactorial including acute CHF (type unknown), possible pulmonary hypertension, morbid obesity with OSA and possible OHS, anemia, and physical deconditioning  -Has been weaned off supplemental oxygen and is now on room air at rest -Need to assess his ambulatory oxygen status  2. Acute CHF, type unknown: -Await echo to evaluate EF, diastolic function, and pulmonary arterial pressure -He has had vigorous UOP overnight of ~ 4.7 L with a slight bump in renal function from 1.40-->1.56, though the only SCr we have to compare to is from 2017 at 1.7-1.8 -Scale back on his IV Lasix today to 40 mg bid with KCl repletion -Continue Coreg and lisinopril, will titrate Coreg to 6.25 mg  bid (was on 12.5 mg bid at home) -Attempt to obtain records from the [facility] (will have patient sign a release) -If echo demonstrates a reduced EF, and we are unable to determine if this is new or not, he may require ischemic evaluation following diuresis  -Escalate evidence-based heart failure therapy as echo, labs, and vitals dictate  -He will  -CHF education -Daily weights -Strict Is and Os  3. Reported nonobstructive CAD/elevated  troponin: -Attempt to obtain cardiac cath report from 2014 at the [place] -No symptoms of angina -Elevated troponin minimal at 0.03 and flat trending likely supply demand ischemia in the setting of volume overload and CKD -ASA -Echo as above -May need ischemic evaluation pending results and records as above  4. Reported bioprosthetic AVR: -Echo pending as above -Followed at the [facility] -Possibly playing a role in some of his symptoms  5. CKD stage II: -Baseline SCr uncertain as we only have a prior SCr value from 2017 of 1.7-1.8 -Presenting SCr of 1.4 on 5/8 with a slight bump to 1.56 with diuresis of 4.7 L overnight -Decrease IV Lasix as above -Monitor  6. Anemia: -Likely of chronic disease -Stable -Per IM  7. Morbid obesity with OSA on CPA and possible OHS: -Weight advised -Compliance with CPAP advised  8. IDDM: -Per IM  9. HTN: -Blood pressure suboptimally controlled -Increase his Coreg as above   10. Hypomagnesemia: -Replete IV to goal > 2.0   For questions or updates, please contact [facility] Please consult [email or web address] for contact info under Cardiology/STEMI.   Signed, Eula Listen, PA-C [facility] Pager: [phone] 07/15/2017, 7:59 AM

## 2017-07-15 NOTE — Progress Notes (Signed)
[facility] - [place] at [facility]   PATIENT NAME: Taylor Stephenson    MR#:  [id number]  DATE OF BIRTH:  [birth year]  SUBJECTIVE:  CHIEF COMPLAINT:  Sob is better than yesterday  REVIEW OF SYSTEMS:  CONSTITUTIONAL: No fever, fatigue or weakness.  EYES: No blurred or double vision.  EARS, NOSE, AND THROAT: No tinnitus or ear pain.  RESPIRATORY: No cough, still shortness of breath, no wheezing or hemoptysis.  CARDIOVASCULAR: No chest pain, orthopnea, edema.  GASTROINTESTINAL: No nausea, vomiting, diarrhea or abdominal pain.  GENITOURINARY: No dysuria, hematuria.  ENDOCRINE: No polyuria, nocturia,  HEMATOLOGY: No anemia, easy bruising or bleeding SKIN: No rash or lesion. MUSCULOSKELETAL: No joint pain or arthritis.   NEUROLOGIC: No tingling, numbness, weakness.  PSYCHIATRY: No anxiety or depression.   DRUG ALLERGIES:   Allergies  Allergen Reactions  . Latex     VITALS:  Blood pressure (!) 123/57, pulse 73, temperature 98.6 F (37 C), temperature source Oral, resp. rate 18, height  (1.702 m), weight (!) 145.6 kg (321 lb), SpO2 92 %.  PHYSICAL EXAMINATION:  GENERAL:  72 y.o.-year-old patient lying in the bed with no acute distress.  EYES: Pupils equal, round, reactive to light and accommodation. No scleral icterus. Extraocular muscles intact.  HEENT: Head atraumatic, normocephalic. Oropharynx and nasopharynx clear.  NECK:  Supple, no jugular venous distention. No thyroid enlargement, no tenderness.  LUNGS:Mod breath sounds bilaterally, no wheezing, has rales,rhonchi, no  crepitation. No use of accessory muscles of respiration.  CARDIOVASCULAR: S1, S2 normal. No murmurs, rubs, or gallops.  ABDOMEN: Soft, nontender, nondistended. Bowel sounds present. No organomegaly or mass.  EXTREMITIES: No pedal edema, cyanosis, or clubbing.  NEUROLOGIC: Cranial nerves II through XII are intact. Muscle strength 5/5 in all extremities. Sensation intact. Gait not  checked.  PSYCHIATRIC: The patient is alert and oriented x 3.  SKIN: No obvious rash, lesion, or ulcer.    LABORATORY PANEL:   CBC Recent Labs  Lab 07/15/17 0424  WBC 6.0  HGB 10.6*  HCT 32.2*  PLT 180   ------------------------------------------------------------------------------------------------------------------  Chemistries  Recent Labs  Lab 07/15/17 0424  NA 138  K 3.7  CL 102  CO2 29  GLUCOSE 155*  BUN 26*  CREATININE 1.56*  CALCIUM 8.3*  MG 1.5*   ------------------------------------------------------------------------------------------------------------------  Cardiac Enzymes Recent Labs  Lab 07/15/17 0424  TROPONINI 0.03*   ------------------------------------------------------------------------------------------------------------------  RADIOLOGY:  Dg Chest 2 View  Result Date: 07/14/2017 CLINICAL DATA:  Shortness of breath, diabetes, hypertension EXAM: CHEST - 2 VIEW COMPARISON:  09/27/2015 FINDINGS: Previous median sternotomy. Aortic valve replacement noted. Stable cardiomegaly with vascular congestion. No superimposed pneumonia, collapse or consolidation. Negative for edema, effusion or pneumothorax. Trachea is midline. Aorta is atherosclerotic. Degenerative changes of the spine. IMPRESSION: Stable postoperative findings. Stable cardiomegaly with vascular congestion No interval change. Electronically Signed   By: Judie Petit.  Shick M.D.   On: 07/14/2017 09:15    EKG:   Orders placed or performed during the hospital encounter of 07/14/17  . ED EKG  . ED EKG  . EKG 12-Lead  . EKG 12-Lead  . EKG 12-Lead  . EKG 12-Lead    ASSESSMENT AND PLAN:    #Acute hypoxic respiratory distress secondary to acute on chronic diastolic CHF secondary to fluid overload from overdrinking as patient was told by [facility] that he was dehydrated  Lasix 60 mg IV every 12 hours  echocardiograph Seen by cardiology [facility] . Monitor daily weight intake and output  #CKD  stage  III, Stable, follow-up BMP while on Lasix.  #Hypertension continue home hypertension medication.  OSA.  Continue home CPAP setting.  Diabetes.  sliding scale and continue home Lantus.  #Morbid obesity Patient will be benefited with diet and exercise and less still modifications Fluid restriction Consult dietitian    All the records are reviewed and case discussed with Care Management/Social Workerr. Management plans discussed with the patient, family and they are in agreement.  CODE STATUS: full code  TOTAL TIME TAKING CARE OF THIS PATIENT: 35 minutes.   POSSIBLE D/C IN 2  DAYS, DEPENDING ON CLINICAL CONDITION.  Note: This dictation was prepared with Dragon dictation along with smaller phrase technology. Any transcriptional errors that result from this process are unintentional.   Ramonita Lab M.D on 07/15/2017 at 4:43 PM  Between 7am to 6pm - Pager - [phone] After 6pm go to [email or web address] - password [email or web address] [facility]  [place]  Office  [phone]  CC: Primary care physician; [facility]

## 2017-07-15 NOTE — Progress Notes (Signed)
MEDICATION RELATED CONSULT NOTE - INITIAL   Pharmacy Consult for Electrolyte Management   Allergies  Allergen Reactions  . Latex     Patient Measurements: Height:  (170.2 cm) Weight: (!) 321 lb (145.6 kg) IBW/kg (Calculated) : 66.1 Adjusted Body Weight:   Vital Signs: BP: 123/57 (05/09 1628) Pulse Rate: 73 (05/09 1628) Intake/Output from previous day: 05/08 0701 - 05/09 0700 In: -  Out: 4775 [Urine:4775] Intake/Output from this shift: Total I/O In: -  Out: 600 [Urine:600]  Labs: Recent Labs    07/14/17 0800 07/15/17 0424  WBC 6.4 6.0  HGB 11.1* 10.6*  HCT 34.1* 32.2*  PLT 187 180  CREATININE 1.40* 1.56*  MG  --  1.5*   Estimated Creatinine Clearance: 59.3 mL/min (A) (by C-G formula based on SCr of 1.56 mg/dL (H)).   Microbiology: No results found for this or any previous visit (from the past 720 hour(s)).  Medical History: Past Medical History:  Diagnosis Date  . Aortic valve disease    a. s/p bioprosthetic AVR in ~ 2014  . CKD (chronic kidney disease), stage II   . Diabetes mellitus with complication (HCC)   . Hypertension   . Morbid obesity (HCC)   . OSA (obstructive sleep apnea)    a. on CPAP     Assessment: Patient is a 72yo male admitted for acute CHF. Pharmacy consulted to manage electrolytes.  K= 3.7, Mag=1.5  Patient received KCl PO x 1 and Mag Sulfate 2g IV x 1 today.  Goal of Therapy:  Maintain electrolytes within range  Plan:  Will follow up on AM labs.  Clovia Cuff, PharmD, BCPS 07/15/2017 7:12 PM

## 2017-07-16 LAB — GLUCOSE, CAPILLARY
GLUCOSE-CAPILLARY: 99 mg/dL (ref 65–99)
Glucose-Capillary: 88 mg/dL (ref 65–99)
Glucose-Capillary: 180 mg/dL — ABNORMAL HIGH (ref 65–99)

## 2017-07-16 LAB — BASIC METABOLIC PANEL
ANION GAP: 8 (ref 5–15)
BUN: 28 mg/dL — ABNORMAL HIGH (ref 6–20)
CHLORIDE: 97 mmol/L — AB (ref 101–111)
CO2: 32 mmol/L (ref 22–32)
Calcium: 8.8 mg/dL — ABNORMAL LOW (ref 8.9–10.3)
Creatinine, Ser: 1.35 mg/dL — ABNORMAL HIGH (ref 0.61–1.24)
GFR calc Af Amer: 59 mL/min — ABNORMAL LOW (ref >60)
GFR calc non Af Amer: 51 mL/min — ABNORMAL LOW (ref >60)
GLUCOSE: 112 mg/dL — AB (ref 65–99)
Potassium: 3.8 mmol/L (ref 3.5–5.1)
Sodium: 137 mmol/L (ref 135–145)

## 2017-07-16 LAB — MAGNESIUM: Magnesium: 1.8 mg/dL (ref 1.7–2.4)

## 2017-07-16 NOTE — Progress Notes (Signed)
[facility] - [place] at [facility]   PATIENT NAME: Taylor Stephenson    MR#:  [id number]  DATE OF BIRTH:  03/22/1945  SUBJECTIVE:  CHIEF COMPLAINT:  Sob is better , 3.9 lit outpul in the past 24 hours and 10 ;it since admission  REVIEW OF SYSTEMS:  CONSTITUTIONAL: No fever, fatigue or weakness.  EYES: No blurred or double vision.  EARS, NOSE, AND THROAT: No tinnitus or ear pain.  RESPIRATORY: No cough, still shortness of breath, no wheezing or hemoptysis.  CARDIOVASCULAR: No chest pain, orthopnea, edema.  GASTROINTESTINAL: No nausea, vomiting, diarrhea or abdominal pain.  GENITOURINARY: No dysuria, hematuria.  ENDOCRINE: No polyuria, nocturia,  HEMATOLOGY: No anemia, easy bruising or bleeding SKIN: No rash or lesion. MUSCULOSKELETAL: No joint pain or arthritis.   NEUROLOGIC: No tingling, numbness, weakness.  PSYCHIATRY: No anxiety or depression.   DRUG ALLERGIES:   Allergies  Allergen Reactions  . Latex     VITALS:  Blood pressure (!) 143/47, pulse 69, temperature 98.4 F (36.9 C), temperature source Oral, resp. rate 20, height  (1.702 m), weight (!) 143.6 kg (316 lb 9.3 oz), SpO2 92 %.  PHYSICAL EXAMINATION:  GENERAL:  72 y.o.-year-old patient lying in the bed with no acute distress.  EYES: Pupils equal, round, reactive to light and accommodation. No scleral icterus. Extraocular muscles intact.  HEENT: Head atraumatic, normocephalic. Oropharynx and nasopharynx clear.  NECK:  Supple, no jugular venous distention. No thyroid enlargement, no tenderness.  LUNGS:Mod breath sounds bilaterally, no wheezing, has rales,rhonchi, no  crepitation. No use of accessory muscles of respiration.  CARDIOVASCULAR: S1, S2 normal. No murmurs, rubs, or gallops.  ABDOMEN: Soft, nontender, nondistended. Bowel sounds present. No organomegaly or mass.  EXTREMITIES: 1+ pitting pedal edema, cyanosis, or clubbing.  NEUROLOGIC: Cranial nerves II through XII are intact.  Muscle strength 5/5 in all extremities. Sensation intact. Gait not checked.  PSYCHIATRIC: The patient is alert and oriented x 3.  SKIN: No obvious rash, lesion, or ulcer.    LABORATORY PANEL:   CBC Recent Labs  Lab 07/15/17 0424  WBC 6.0  HGB 10.6*  HCT 32.2*  PLT 180   ------------------------------------------------------------------------------------------------------------------  Chemistries  Recent Labs  Lab 07/16/17 0415  NA 137  K 3.8  CL 97*  CO2 32  GLUCOSE 112*  BUN 28*  CREATININE 1.35*  CALCIUM 8.8*  MG 1.8   ------------------------------------------------------------------------------------------------------------------  Cardiac Enzymes Recent Labs  Lab 07/15/17 0424  TROPONINI 0.03*   ------------------------------------------------------------------------------------------------------------------  RADIOLOGY:  No results found.  EKG:   Orders placed or performed during the hospital encounter of 07/14/17  . ED EKG  . ED EKG  . EKG 12-Lead  . EKG 12-Lead  . EKG 12-Lead  . EKG 12-Lead    ASSESSMENT AND PLAN:    #Acute hypoxic respiratory distress secondary to acute on chronic diastolic CHF secondary to fluid overload from overdrinking as patient was told by [facility] that he was dehydrated  Lasix 60 mg IV every 12 hours changed to Lasix 40 mg IV every 12 hours which will be continued for another 24 hours and reassess the patient.  Continue Coreg and ACE inhibitor lisinopril 10 mg Echocardiograph- 55 to 60% ejection fraction with no regional wall motion abnormalities 3.9 L output  in the past 24 hours and 10 L since admission Seen by cardiology [facility] . Monitor daily weight intake and output CHF education, diet education provided  #CKD stage III, Creatinine 1.56-1.35 seems to be at baseline Stable, follow-up  BMP while on Lasix.  #Hypertension continue home hypertension medication.  OSA.  Continue home CPAP setting.  Diabetes.  sliding  scale and continue home Lantus.  #Morbid obesity Patient will be benefited with diet and exercise and less still modifications Fluid restriction Consult dietitian  Generalized weakness PT consult placed  All the records are reviewed and case discussed with Care Management/Social Workerr. Management plans discussed with the patient, family and they are in agreement.  CODE STATUS: full code  TOTAL TIME TAKING CARE OF THIS PATIENT: 35 minutes.   POSSIBLE D/C IN 1- 2  DAYS, DEPENDING ON CLINICAL CONDITION.  Note: This dictation was prepared with Dragon dictation along with smaller phrase technology. Any transcriptional errors that result from this process are unintentional.   Ramonita Lab M.D on 07/16/2017 at 2:49 PM  Between 7am to 6pm - Pager - [phone] After 6pm go to [email or web address] - password [email or web address] [facility]  [place] [facility]  Office  [phone]  CC: Primary care physician; Center, [facility]

## 2017-07-16 NOTE — Plan of Care (Signed)
Nutrition Education Note  RD consulted for nutrition education regarding HF.  73 y/o male admitted with CHF  RD provided "Low Sodium Nutrition Therapy" handout from the Academy of Nutrition and Dietetics. Reviewed patient's dietary recall. Provided examples on ways to decrease sodium intake in diet. Discouraged intake of processed foods and use of salt shaker. Encouraged fresh fruits and vegetables as well as whole grain sources of carbohydrates to maximize fiber intake.   RD discussed why it is important for patient to adhere to diet recommendations, and emphasized the role of fluids, foods to avoid, and importance of weighing self daily. Teach back method used.  Expect fair compliance.  Body mass index is 49.58 kg/m. Pt meets criteria for morbid obesity based on current BMI.  Current diet order is HH/CHO, patient is consuming approximately 100% of meals at this time. Labs and medications reviewed. No further nutrition interventions warranted at this time. RD contact information provided. If additional nutrition issues arise, please re-consult RD.   Betsey Holiday MS, RD, LDN Pager #- [phone] Office#- [phone] After Hours Pager: [phone]

## 2017-07-16 NOTE — Care Management (Signed)
Patient lives at home with his wife.  He is followed at the [facility] for all his medical needs. PCP is Dr Tereasa Coop at the [facility].  His cardiologist is at the  [facility] and only knows his last name begins with 'S'. Has access to a cane and a walker. Has access to scales.  has not been weighing everyday "but I will now."  Discussed mobilization of patient with primary nurse.  Denies there are issues getting to appointments "unless I am feeling real bad like this time." Says transfer was discussed to the [place] but received permission to remain at [facility]. Currently is not receiving any services in the home. Currently on room air.  Denies problems obtaining his medications

## 2017-07-16 NOTE — Care Management Important Message (Signed)
Copy of signed IM left in patient's room.    

## 2017-07-16 NOTE — Progress Notes (Signed)
Pharmacy Electrolyte Monitoring Consult:  Pharmacy consulted to assist in monitoring and replacing electrolytes in this 72 y.o. male admitted on 07/14/2017 with acute on chronic CHF.   Labs:  Sodium (mmol/L)  Date Value  07/16/2017 137   Potassium (mmol/L)  Date Value  07/16/2017 3.8   Magnesium (mg/dL)  Date Value  52/84/1324 1.8   Calcium (mg/dL)  Date Value  40/12/2723 8.8 (L)   Albumin (g/dL)  Date Value  36/64/4034 3.9    Assessment/Plan: Electrolytes WNL. Patient is on Lasix with K supplementation. Will f/u AM labs.   Luisa Hart D 07/16/2017 1:05 PM

## 2017-07-16 NOTE — Progress Notes (Signed)
Cardiovascular and Pulmonary Nurse Navigator Note: 72 year old male with hx of chronic diastolic CHF, CKD III, OSA, HTN, DM, Morbid Obesity, Aortic Valve Replacement, pulmonary HTN.   Patient admitted with dx of acute on chronic diastolic CHF.  Patient had a poor diet and high fluid intake despite a recent hospitalization to the [facility] for similar symptoms.  Patient receives his care from the [place] and there are no records available.  Records have been requested by the assigned nurse yesterday.    Educational session with patient completed:   Provided patient with "Living Better with Heart Failure" packet. Briefly reviewed definition of heart failure and signs and symptoms of an exacerbation.  Explained to patient HF is a chronic illness which must be self assessed / self-managed along with the help of your Cardiologist and HF Clinic in order to keep him functioning in his home and to prevent ER visits.   *Reviewed importance of and reason behind checking weight daily in the AM, after using the bathroom, but before getting dressed. Patient informed this RN that she used to weigh herself every morning, but she just got out of the habit of doing this. Patient has scales.   Reviewed the following information for when the cast is removed.  *Discussed when to call the Dr= weight gain of >2-3lb overnight or 5lb in a week,  *Discussed yellow zone= call MD: weight gain of >2-3lb overnight or 5lb in a week, increased swelling, increased SOB when lying down, chest discomfort, dizziness, increased fatigue *Red Zone= call 911: struggle to breath, fainting or near fainting, significant chest pain   *Reviewed low sodium diet-provided handout of recommended and not recommended foods. Dietitian Consultation entered for diet education.  Fluid intake/restriction also discussed.    *Instructed patient to take medications as prescribed for heart failure. Explained briefly why pt is on the medications (either make you  feel better, live longer or keep you out of the hospital) and discussed monitoring and side effects.   *Discussed exercise.  Patient describes himself at [employer]," as he stays home, cooks, runs errands, etc., while his wife works at [employer].   Patient informed this RN after he had his CABG at the [place] he joined a gym and lost 30 pounds.  Patient stated he was NOT referred to Cardiac Rehab after he had his CABG.  Overview of the Cardiac Rehab program provided.  Explained to patient that with VA benefits and the dx of CHF he is a candidate for Cardiac Rehab.  If the referral is generated from the [place] then Cardiac Rehab will be 100% covered.  Cardiac Rehab brochure with department phone number and fax provided to patient.  Patient very interested in participating in Cardiac Rehab.    Patient for possible discharge tomorrow. Spoke to Dr. Amado Coe about Physical Therapy consult.  Dr. Amado Coe to enter order for PT evaluation.    *Smoking Cessation - patient is a former smoker, who quit at age 89.     *Role of [facility] HF Clinic discussed. Heart Failure Clinic Appointment scheduled for Jul 30, 2017 at 0900.   Patient agreeable to being followed in Heart Failure Clinic. Directions provided.   Patient engaged during this session and asked pertinent questions.    Army Melia, RN, BSN, [facility]  [place]  [facility] Cardiac & Pulmonary Rehab  Cardiovascular & Pulmonary Nurse Navigator  Direct Line: [phone]  Department Phone #: [phone] Fax: [phone]  Email Address: [email or web address]

## 2017-07-16 NOTE — Progress Notes (Signed)
Progress Note  Patient Name: Diar Berkel Date of Encounter: 07/16/2017  Primary Cardiologist: [facility]  Subjective   Breathing improved, abd less firm. Lower ext edema much improved. No chest pain.  Inpatient Medications    Scheduled Meds: . acidophilus  1 capsule Oral BID  . aspirin EC  81 mg Oral Daily  . atorvastatin  40 mg Oral QHS  . carvedilol  6.25 mg Oral BID WC  . furosemide  40 mg Intravenous Q12H  . heparin  5,000 Units Subcutaneous Q8H  . insulin aspart  0-5 Units Subcutaneous QHS  . insulin aspart  0-9 Units Subcutaneous TID WC  . insulin glargine  50 Units Subcutaneous BID  . lisinopril  10 mg Oral Daily  . pantoprazole  40 mg Oral Daily  . potassium chloride  20 mEq Oral Daily  . sertraline  50 mg Oral Daily  . sodium chloride flush  3 mL Intravenous Q12H  . tamsulosin  0.4 mg Oral Daily   Continuous Infusions: . sodium chloride     PRN Meds: sodium chloride, acetaminophen **OR** acetaminophen, albuterol, bisacodyl, HYDROcodone-acetaminophen, insulin aspart, ondansetron **OR** ondansetron (ZOFRAN) IV, senna-docusate, sodium chloride flush   Vital Signs    Vitals:   07/15/17 1628 07/15/17 2055 07/16/17 0355 07/16/17 0500  BP: (!) 123/57 (!) 153/57 (!) 153/67   Pulse: 73 71 68   Resp: Temp:  98.6 F (37 C) 98.4 F (36.9 C)   TempSrc:  Oral Oral   SpO2: 92% 92% 93%   Weight:    (!) 316 lb 9.3 oz (143.6 kg)  Height:        Intake/Output Summary (Last 24 hours) at 07/16/2017 0748 Last data filed at 07/16/2017 0355 Gross per 24 hour  Intake 600 ml  Output 4500 ml  Net -3900 ml   Filed Weights   07/14/17 0757 07/15/17 0355 07/16/17 0500  Weight: (!) 338 lb (153.3 kg) (!) 321 lb (145.6 kg) (!) 316 lb 9.3 oz (143.6 kg)    Physical Exam   GEN: obese, in no acute distress.  HEENT: Grossly normal.  Neck: Supple, obese, cannot gauge jvp, no carotid bruits, or masses. Cardiac: RRR, 2/6 SEM RUSB, no rubs, or gallops. No clubbing,  cyanosis, trace bilat le edema.  Radials/DP/PT 2+ and equal bilaterally.  Respiratory:  Respirations regular and unlabored, diminished breath sounds bilat. GI: obese, firm, protuberant with abd wall/flank edema - 1+. Nontender, BS + x 4. MS: no deformity or atrophy. Skin: warm and dry, no rash. Neuro:  Strength and sensation are intact. Psych: AAOx3.  Normal affect.  Labs    Chemistry Recent Labs  Lab 07/14/17 0800 07/15/17 0424 07/16/17 0415  NA 141 138 137  K 4.9 3.7 3.8  CL 106 102 97*  CO2 29 29 32  GLUCOSE 167* 155* 112*  BUN 29* 26* 28*  CREATININE 1.40* 1.56* 1.35*  CALCIUM 8.5* 8.3* 8.8*  GFRNONAA 49* 43* 51*  GFRAA 56* 49* 59*  ANIONGAP Hematology Recent Labs  Lab 07/14/17 0800 07/15/17 0424  WBC 6.4 6.0  RBC 3.51* 3.39*  HGB 11.1* 10.6*  HCT 34.1* 32.2*  MCV 97.0 95.1  MCH 31.7 31.4  MCHC 32.7 33.1  RDW 17.9* 16.9*  PLT 187 180    Cardiac Enzymes Recent Labs  Lab 07/14/17 0800 07/14/17 1628 07/14/17 2320 07/15/17 0424  TROPONINI <0.03 <0.03 0.03* 0.03*      BNP Recent Labs  Lab 07/14/17 0756  BNP 519.0*      Radiology    Dg Chest 2 View  Result Date: 07/14/2017 CLINICAL DATA:  Shortness of breath, diabetes, hypertension EXAM: CHEST - 2 VIEW COMPARISON:  09/27/2015 FINDINGS: Previous median sternotomy. Aortic valve replacement noted. Stable cardiomegaly with vascular congestion. No superimposed pneumonia, collapse or consolidation. Negative for edema, effusion or pneumothorax. Trachea is midline. Aorta is atherosclerotic. Degenerative changes of the spine. IMPRESSION: Stable postoperative findings. Stable cardiomegaly with vascular congestion No interval change. Electronically Signed   By: Judie Petit.  Shick M.D.   On: 07/14/2017 09:15    Telemetry    RSR, 1st deg avb, freq PACs - Personally Reviewed  Cardiac Studies   2D Echocardiogram 5.8.2019  Study Conclusions   - Left ventricle: The cavity size was normal. There was  moderate   concentric hypertrophy. Systolic function was normal. The   estimated ejection fraction was in the range of 55% to 60%. Wall   motion was normal; there were no regional wall motion   abnormalities. Features are consistent with a pseudonormal left   ventricular filling pattern, with concomitant abnormal relaxation   and increased filling pressure (grade 2 diastolic dysfunction). - Aortic valve: There was very mild stenosis. There was mild   regurgitation. - Mitral valve: Calcified annulus. Mildly thickened leaflets .   There was mild regurgitation. - Left atrium: The atrium was mildly dilated. - Right ventricle: The cavity size was moderately dilated. Wall   thickness was normal. Systolic function was moderately reduced. - Pulmonary arteries: Systolic pressure could not be accurately   estimated.   Patient Profile     72 y.o. 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  Assessment & Plan    1.  Acute on chronic diastolic CHF:  Admitted 5/8 w/ volume overload in the setting of poor dietary/med compliance.  Echo this admission showed nl EF w/ Gr2 DD.  Feeling better - abd less full/firm.  Minus 3.9L overnight, 8.6L since admission.  Wt 316 lbs this AM - down from 321 yesterday.  Reported dry wt of ~ 300 lbs 2 mos ago or so.  BUN/bicarb sl up, creat stable.  Cont current dose of lasix.  HR stable.  BP up slightly this AM, was better yesterday.  Follow.  2.  Essential HTN: Up this AM but trended better during day yesterday.  Follow w/ diuresis.  Room to titrate  blocker if necessary.  3.  DM II: Gluc 112 this AM.  Insulin per IM.  4.  CKD II: creat relatively stable.  BUN/bicarb bumping slightly.  Follow.  5.  S/p bioprosthetic AVR: Mild AS/MR on echo.  6.   Elevated Troponin: No chest pain.  Mild elevation w/ flat trend - 0.03 x 2.  Nl EF on echo.  Prev report of nonobs dzs on cath prior to valve surgery.  No plan for isch eval @ this time.  7.  OSA:  Uses CPAP.  Signed, Nicolasa Ducking, NP  07/16/2017, 7:48 AM    For questions or updates, please contact   Please consult [email or web address] for contact info under Cardiology/STEMI.

## 2017-07-16 NOTE — Progress Notes (Addendum)
A request for cardiology records was faxed to the [facility] medical records yesterday afternoon.  He wife enquired about it today and was told no request was received.  I spook the a staff member at the [place] and confirmed it was not received.  I faxed it again.  I called to confirm receipt just now and was told it was not received.  I confirmed the fax number which is the number I have been using.  The request was sent again just now.  1427:  [facility] has requested I use their form for the request.  This form was just faxed to them.

## 2017-07-16 NOTE — Progress Notes (Signed)
1700 CBG = 78.  Didn't pass from glucometer to [employer]

## 2017-07-17 DIAGNOSIS — G4733 Obstructive sleep apnea (adult) (pediatric): Secondary | ICD-10-CM

## 2017-07-17 DIAGNOSIS — Z9989 Dependence on other enabling machines and devices: Secondary | ICD-10-CM

## 2017-07-17 DIAGNOSIS — I5031 Acute diastolic (congestive) heart failure: Secondary | ICD-10-CM

## 2017-07-17 DIAGNOSIS — Z953 Presence of xenogenic heart valve: Secondary | ICD-10-CM

## 2017-07-17 LAB — BASIC METABOLIC PANEL
Anion gap: 9 (ref 5–15)
BUN: 25 mg/dL — AB (ref 6–20)
CO2: 33 mmol/L — ABNORMAL HIGH (ref 22–32)
Calcium: 8.9 mg/dL (ref 8.9–10.3)
Chloride: 95 mmol/L — ABNORMAL LOW (ref 101–111)
Creatinine, Ser: 1.33 mg/dL — ABNORMAL HIGH (ref 0.61–1.24)
GFR, EST NON AFRICAN AMERICAN: 52 mL/min — AB (ref >60)
Glucose, Bld: 121 mg/dL — ABNORMAL HIGH (ref 65–99)
POTASSIUM: 4 mmol/L (ref 3.5–5.1)
SODIUM: 137 mmol/L (ref 135–145)

## 2017-07-17 LAB — GLUCOSE, CAPILLARY
GLUCOSE-CAPILLARY: 113 mg/dL — AB (ref 65–99)
Glucose-Capillary: 139 mg/dL — ABNORMAL HIGH (ref 65–99)
Glucose-Capillary: 119 mg/dL — ABNORMAL HIGH (ref 65–99)

## 2017-07-17 LAB — MAGNESIUM: MAGNESIUM: 1.7 mg/dL (ref 1.7–2.4)

## 2017-07-17 MED ORDER — INSULIN ASPART 100 UNIT/ML ~~LOC~~ SOLN
20.0000 [IU] | Freq: Three times a day (TID) | SUBCUTANEOUS | Status: DC
  Administered 2017-07-17 – 2017-07-18 (×2): 20 [IU] via SUBCUTANEOUS
  Filled 2017-07-17 (×2): qty 1

## 2017-07-17 MED ORDER — MAGNESIUM SULFATE 2 GM/50ML IV SOLN
2.0000 g | Freq: Once | INTRAVENOUS | Status: AC
Start: 2017-07-17 — End: 2017-07-17
  Administered 2017-07-17: 2 g via INTRAVENOUS
  Filled 2017-07-17: qty 50

## 2017-07-17 NOTE — Progress Notes (Signed)
Progress Note  Patient Name: Taylor Stephenson Date of Encounter: 07/17/2017  Primary Cardiologist: [facility]  Subjective   Napping with his Cpap this morning Has not spent much time out of bed 3.9 L negative past 24 hours Total of 16 L negative this admission Has indwelling Foley catheter  Asking for cups of ice so he can make Gatorade and iced tea   Inpatient Medications    Scheduled Meds: . acidophilus  1 capsule Oral BID  . aspirin EC  81 mg Oral Daily  . atorvastatin  40 mg Oral QHS  . carvedilol  6.25 mg Oral BID WC  . furosemide  40 mg Intravenous Q12H  . heparin  5,000 Units Subcutaneous Q8H  . insulin aspart  0-5 Units Subcutaneous QHS  . insulin aspart  0-9 Units Subcutaneous TID WC  . insulin aspart  20 Units Subcutaneous TID WC  . insulin glargine  50 Units Subcutaneous BID  . lisinopril  10 mg Oral Daily  . pantoprazole  40 mg Oral Daily  . potassium chloride  20 mEq Oral Daily  . sertraline  50 mg Oral Daily  . sodium chloride flush  3 mL Intravenous Q12H  . tamsulosin  0.4 mg Oral Daily   Continuous Infusions: . sodium chloride     PRN Meds: sodium chloride, acetaminophen **OR** acetaminophen, albuterol, bisacodyl, HYDROcodone-acetaminophen, ondansetron **OR** ondansetron (ZOFRAN) IV, senna-docusate, sodium chloride flush   Vital Signs    Vitals:   07/17/17 0403 07/17/17 0500 07/17/17 0804 07/17/17 0923  BP: (!) 152/57  (!) 126/46 (!) 100/29  Pulse: 65  67 72  Resp: 16   18  Temp: 98.3 F (36.8 C)  98.7 F (37.1 C) 98.2 F (36.8 C)  TempSrc: Oral  Oral Oral  SpO2: 95%  91% 90%  Weight:  (!) 310 lb 1.6 oz (140.7 kg)    Height:        Intake/Output Summary (Last 24 hours) at 07/17/2017 1436 Last data filed at 07/17/2017 1354 Gross per 24 hour  Intake 960 ml  Output 7100 ml  Net -6140 ml   Filed Weights   07/15/17 0355 07/16/17 0500 07/17/17 0500  Weight: (!) 321 lb (145.6 kg) (!) 316 lb 9.3 oz (143.6 kg) (!) 310 lb 1.6 oz (140.7 kg)      Physical Exam   Constitutional:  oriented to person, place, and time. No distress. obese HENT:  Head: Normocephalic and atraumatic.  Eyes:  no discharge. No scleral icterus.  Neck: Normal range of motion. Neck supple. No JVD present.  Cardiovascular: Normal rate, regular rhythm, normal heart sounds and intact distal pulses. Exam reveals no gallop and no friction rub. No edema No murmur heard. Pulmonary/Chest: Effort normal and breath sounds normal. No stridor. No respiratory distress.  no wheezes.   Rales at the bases ,no tenderness.  Abdominal: Soft.  no distension.  no tenderness.  Musculoskeletal: Normal range of motion.  no  tenderness or deformity.  Neurological:  normal muscle tone. Coordination normal. No atrophy Skin: Skin is warm and dry. No rash noted. not diaphoretic.  Psychiatric:  normal mood and affect. behavior is normal. Thought content normal.      Labs    Chemistry Recent Labs  Lab 07/15/17 0424 07/16/17 0415 07/17/17 0551  NA 138 137 137  K 3.7 3.8 4.0  CL 102 97* 95*  CO2 29 32 33*  GLUCOSE 155* 112* 121*  BUN 26* 28* 25*  CREATININE 1.56* 1.35* 1.33*  CALCIUM 8.3*  8.8* 8.9  GFRNONAA 43* 51* 52*  GFRAA 49* 59* >60  ANIONGAP Hematology Recent Labs  Lab 07/14/17 0800 07/15/17 0424  WBC 6.4 6.0  RBC 3.51* 3.39*  HGB 11.1* 10.6*  HCT 34.1* 32.2*  MCV 97.0 95.1  MCH 31.7 31.4  MCHC 32.7 33.1  RDW 17.9* 16.9*  PLT 187 180    Cardiac Enzymes Recent Labs  Lab 07/14/17 0800 07/14/17 1628 07/14/17 2320 07/15/17 0424  TROPONINI <0.03 <0.03 0.03* 0.03*      BNP Recent Labs  Lab 07/14/17 0756  BNP 519.0*      Radiology    No results found.  Telemetry    RSR, 1st deg avb, freq PACs - Personally Reviewed  Cardiac Studies   2D Echocardiogram 5.8.2019  Study Conclusions   - Left ventricle: The cavity size was normal. There was moderate   concentric hypertrophy. Systolic function was normal. The   estimated  ejection fraction was in the range of 55% to 60%. Wall   motion was normal; there were no regional wall motion   abnormalities. Features are consistent with a pseudonormal left   ventricular filling pattern, with concomitant abnormal relaxation   and increased filling pressure (grade 2 diastolic dysfunction). - Aortic valve: There was very mild stenosis. There was mild   regurgitation. - Mitral valve: Calcified annulus. Mildly thickened leaflets .   There was mild regurgitation. - Left atrium: The atrium was mildly dilated. - Right ventricle: The cavity size was moderately dilated. Wall   thickness was normal. Systolic function was moderately reduced. - Pulmonary arteries: Systolic pressure could not be accurately   estimated.   Patient Profile     72 y.o. 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  Assessment & Plan    1--acute on chronic diastolic CHF Poor diet, high fluid intake at home Does not weight himself Asking for cups of ice for his Gatorade and iced tea, strongly recommended he moderate his fluid intake Would continue inpatient IV Lasix Renal function stable -Cardiac rehab -at discharge will likely need Lasix 40 twice a day  2) morbid obesity Likely contributing to above Dietary discretion, fluid indiscretion May benefit from meeting with nutritionist  3) acute respiratory distress with hypoxia Secondary to acute  on chronic diastolic CHF, may be exacerbated by bioprosthetic aortic valve, morbid obesity, sleep apnea, Unable to exclude obesity hypoventilation syndrome He uses a CPAP at night and with napping  4 chronic kidney disease)  Secondary to diabetes, Stable renal function with diuresis  5) diabetes Poor diet, morbid obesity    needs nutrition consult Discussed again with him today need to decrease his fluid intake  Signed, Julien Nordmann, MD  07/17/2017, 2:36 PM    For questions or updates, please contact   Please consult [email or web address] for contact info under Cardiology/STEMI.

## 2017-07-17 NOTE — Progress Notes (Signed)
Pharmacy Electrolyte Monitoring Consult:  Pharmacy consulted to assist in monitoring and replacing electrolytes in this 72 y.o. male admitted on 07/14/2017 with acute on chronic CHF.   Labs:  Sodium (mmol/L)  Date Value  07/17/2017 137   Potassium (mmol/L)  Date Value  07/17/2017 4.0   Magnesium (mg/dL)  Date Value  16/12/9602 1.7   Calcium (mg/dL)  Date Value  54/11/8117 8.9   Albumin (g/dL)  Date Value  14/78/2956 3.9    Assessment/Plan: Magnesium at the low end of normal. Will replace with Mag Sulfate 2gm IV x 1 dose. Patient is on Lasix with K supplementation.  Will f/u AM labs.   Gardner Candle, PharmD, BCPS Clinical Pharmacist 07/17/2017 8:17 AM

## 2017-07-17 NOTE — Progress Notes (Signed)
[facility] [place] at [facility]   PATIENT NAME: Taylor Stephenson    MR#:  [id number]  DATE OF BIRTH:  [birth year]  SUBJECTIVE:  CHIEF COMPLAINT:  Sob is better , 4.2lit outpul in the past 24 hours and 16;it since admission  REVIEW OF SYSTEMS:  CONSTITUTIONAL: No fever, fatigue or weakness.  EYES: No blurred or double vision.  EARS, NOSE, AND THROAT: No tinnitus or ear pain.  RESPIRATORY: No cough, still shortness of breath, no wheezing or hemoptysis.  CARDIOVASCULAR: No chest pain, orthopnea, edema.  GASTROINTESTINAL: No nausea, vomiting, diarrhea or abdominal pain.  GENITOURINARY: No dysuria, hematuria.  ENDOCRINE: No polyuria, nocturia,  HEMATOLOGY: No anemia, easy bruising or bleeding SKIN: No rash or lesion. MUSCULOSKELETAL: No joint pain or arthritis.   NEUROLOGIC: No tingling, numbness, weakness.  PSYCHIATRY: No anxiety or depression.   DRUG ALLERGIES:   Allergies  Allergen Reactions  . Latex     VITALS:  Blood pressure (!) 127/42, pulse 75, temperature 98.2 F (36.8 C), temperature source Oral, resp. rate 18, height  (1.702 m), weight (!) 140.7 kg (310 lb 1.6 oz), SpO2 91 %.  PHYSICAL EXAMINATION:  GENERAL:  72 y.o.-year-old patient lying in the bed with no acute distress.  EYES: Pupils equal, round, reactive to light and accommodation. No scleral icterus. Extraocular muscles intact.  HEENT: Head atraumatic, normocephalic. Oropharynx and nasopharynx clear.  NECK:  Supple, no jugular venous distention. No thyroid enlargement, no tenderness.  LUNGS:Mod breath sounds bilaterally, no wheezing, has rales,rhonchi, no  crepitation. No use of accessory muscles of respiration.  CARDIOVASCULAR: S1, S2 normal. No murmurs, rubs, or gallops.  ABDOMEN: Soft, nontender, nondistended. Bowel sounds present. No organomegaly or mass.  EXTREMITIES: 1+ pitting pedal edema, cyanosis, or clubbing.  NEUROLOGIC: Cranial nerves II through XII are intact.  Muscle strength 5/5 in all extremities. Sensation intact. Gait not checked.  PSYCHIATRIC: The patient is alert and oriented x 3.  SKIN: No obvious rash, lesion, or ulcer.    LABORATORY PANEL:   CBC Recent Labs  Lab 07/15/17 0424  WBC 6.0  HGB 10.6*  HCT 32.2*  PLT 180   ------------------------------------------------------------------------------------------------------------------  Chemistries  Recent Labs  Lab 07/17/17 0551  NA 137  K 4.0  CL 95*  CO2 33*  GLUCOSE 121*  BUN 25*  CREATININE 1.33*  CALCIUM 8.9  MG 1.7   ------------------------------------------------------------------------------------------------------------------  Cardiac Enzymes Recent Labs  Lab 07/15/17 0424  TROPONINI 0.03*   ------------------------------------------------------------------------------------------------------------------  RADIOLOGY:  No results found.  EKG:   Orders placed or performed during the hospital encounter of 07/14/17  . ED EKG  . ED EKG  . EKG 12-Lead  . EKG 12-Lead  . EKG 12-Lead  . EKG 12-Lead    ASSESSMENT AND PLAN:    #Acute hypoxic respiratory distress secondary to acute on chronic diastolic CHF secondary to fluid overload from overdrinking as patient was told by [facility] that he was dehydrated  Lasix 60 mg IV every 12 hours changed to Lasix 40 mg IV every 12 hours which will be continued for another 24-48  hours and reassess the patient.  Continue Coreg and ACE inhibitor lisinopril 10 mg Echocardiograph- 55 to 60% ejection fraction with no regional wall motion abnormalities Monitor daily weight intake and output CHF education, diet education provided Appreciate Dr. Windell Hummingbird recommendations  #CKD stage III, Creatinine 1.56-1.35-1.33 seems to be at baseline Stable, follow-up BMP while on Lasix.  #Hypertension continue home hypertension medication.  OSA.  Continue home CPAP  setting.  Diabetes.  sliding scale and continue home  Lantus.  #Morbid obesity Patient will be benefited with diet and exercise and less still modifications Fluid restriction Consult dietitian  Generalized weakness PT consult placed  All the records are reviewed and case discussed with Care Management/Social Workerr. Management plans discussed with the patient, family and they are in agreement.  CODE STATUS: full code  TOTAL TIME TAKING CARE OF THIS PATIENT: 35 minutes.   POSSIBLE D/C IN 1- 2  DAYS, DEPENDING ON CLINICAL CONDITION.  Note: This dictation was prepared with Dragon dictation along with smaller phrase technology. Any transcriptional errors that result from this process are unintentional.   Ramonita Lab M.D on 07/17/2017 at 4:41 PM  Between 7am to 6pm - Pager - [phone] After 6pm go to [email or web address] - password [email or web address] [facility]  [facility]  Office  [phone]  CC: Primary care physician; Center, [facility]

## 2017-07-18 LAB — GLUCOSE, CAPILLARY: GLUCOSE-CAPILLARY: 218 mg/dL — AB (ref 65–99)

## 2017-07-18 LAB — BASIC METABOLIC PANEL
ANION GAP: 10 (ref 5–15)
BUN: 32 mg/dL — ABNORMAL HIGH (ref 6–20)
CO2: 33 mmol/L — ABNORMAL HIGH (ref 22–32)
Calcium: 9 mg/dL (ref 8.9–10.3)
Chloride: 93 mmol/L — ABNORMAL LOW (ref 101–111)
Creatinine, Ser: 1.5 mg/dL — ABNORMAL HIGH (ref 0.61–1.24)
GFR calc non Af Amer: 45 mL/min — ABNORMAL LOW (ref >60)
GFR, EST AFRICAN AMERICAN: 52 mL/min — AB (ref >60)
GLUCOSE: 134 mg/dL — AB (ref 65–99)
POTASSIUM: 4 mmol/L (ref 3.5–5.1)
Sodium: 136 mmol/L (ref 135–145)

## 2017-07-18 MED ORDER — FUROSEMIDE 40 MG PO TABS: 40.0000 mg | ORAL_TABLET | Freq: Two times a day (BID) | ORAL | 1 refills | Status: DC

## 2017-07-18 MED ORDER — POTASSIUM CHLORIDE CRYS ER 20 MEQ PO TBCR: 20.0000 meq | EXTENDED_RELEASE_TABLET | Freq: Every day | ORAL | 0 refills | Status: AC

## 2017-07-18 MED ORDER — FUROSEMIDE 40 MG PO TABS: 40.0000 mg | ORAL_TABLET | Freq: Two times a day (BID) | ORAL | Status: DC

## 2017-07-18 NOTE — Discharge Instructions (Signed)
Patient advised not to drink excessive iced tea and water. He will follow-up with his cardiologist and primary care physician at [facility]. Patient will make appointments at discharge.

## 2017-07-18 NOTE — Discharge Summary (Signed)
[facility] [place] at [facility]   PATIENT NAME: Taylor Stephenson    MR#:  [id number]  DATE OF BIRTH:  1945-12-14  DATE OF ADMISSION:  07/14/2017 ADMITTING PHYSICIAN: Shaune Pollack, MD  DATE OF DISCHARGE: 07/18/2017  PRIMARY CARE PHYSICIAN: [facility]    ADMISSION DIAGNOSIS:  Generalized edema [R60.1] Dyspnea, unspecified type [R06.00]  DISCHARGE DIAGNOSIS:   Acute on chronic diastolic congestive heart failure chronic kidney disease stage III  SECONDARY DIAGNOSIS:   Past Medical History:  Diagnosis Date  . Aortic valve disease    a. s/p bioprosthetic AVR in ~ 2014  . CKD (chronic kidney disease), stage II   . Diabetes mellitus with complication (HCC)   . Hypertension   . Morbid obesity (HCC)   . OSA (obstructive sleep apnea)    a. on CPAP    HOSPITAL COURSE:  Taylor Stephenson  is a 72 y.o. male with a known history of hypertension, diabetes, OSA, CKD and aortic wall disease.  The patient presented to ED with above chief complaints.  He also complains of shortness of breath on mild exertion, orthopnea, nocturnal dyspnea, worsening leg edema and weight gain.  Chest x-ray show pulmonary congestion.   #Acute hypoxic respiratory distress secondary to acute on chronic diastolic CHF secondary to fluid overload from overdrinking  Fluids at home -patient was diuresis with IV Lasix. Urine output during the hospital stay 20 L -change to Lasix 40 mg BID and potassium 20 MPQ daily. This was discussed with Dr. Mariah Milling. -Continue Coreg and ACE inhibitor lisinopril 10 mg =Echocardiograph- 55 to 60% ejection fraction with no regional wall motion abnormalities -CHF education, diet education provided -Appreciate Dr. Windell Hummingbird recommendations -DC Foley catheter now -patient saturation 94% on room air.  #CKD stage III, Creatinine 1.56-1.35-1.33 seems to be at baseline Stable, follow-up BMP while on Lasix.  #Hypertension continue home hypertension  medication.  #OSA. Continue home CPAP setting.  #Diabetes. sliding scale and continue home Lantus.  #Morbid obesity Patient will be benefited with diet and exercise and less still modifications  Patient uses a cane at home to ambulate. Will have nurse walking around.  Overall feels a lot better and will discharged to home with outpatient follow-up with [facility] cardiology and primary care. CONSULTS OBTAINED:  Treatment Team:  Antonieta Iba, MD  DRUG ALLERGIES:   Allergies  Allergen Reactions  . Latex     DISCHARGE MEDICATIONS:   Allergies as of 07/18/2017      Reactions   Latex       Medication List    TAKE these medications   acetaminophen 500 MG tablet Commonly known as:  TYLENOL Take 500-1,000 mg by mouth every 6 (six) hours as needed.   acidophilus Caps capsule Take 1 capsule by mouth 2 (two) times daily.   albuterol 108 (90 Base) MCG/ACT inhaler Commonly known as:  PROVENTIL HFA;VENTOLIN HFA Inhale 2 puffs into the lungs every 6 (six) hours as needed for wheezing or shortness of breath.   ammonium lactate 12 % lotion Commonly known as:  LAC-HYDRIN Apply 1 application topically as needed for dry skin.   aspirin EC 81 MG tablet Take 81 mg by mouth daily.   atorvastatin 40 MG tablet Commonly known as:  LIPITOR Take 40 mg by mouth at bedtime.   carvedilol 12.5 MG tablet Commonly known as:  COREG Take 12.5 mg by mouth 2 (two) times daily with a meal.   clotrimazole 1 % external solution Commonly known as:  LOTRIMIN Apply 1 application topically 2 (two) times daily.   furosemide 40 MG tablet Commonly known as:  LASIX Take 1 tablet (40 mg total) by mouth 2 (two) times daily. What changed:    medication strength  how much to take  when to take this   GLUCAGON EMERGENCY 1 MG injection Generic drug:  glucagon Inject 1 mg into the vein once as needed (emergency hypoglycemia).   insulin glargine 100 UNIT/ML injection Commonly known as:   LANTUS Inject 50 Units into the skin 2 (two) times daily.   insulin regular 100 units/mL injection Commonly known as:  NOVOLIN R,HUMULIN R Inject 20 Units into the skin 3 (three) times daily before meals. If blood sugar is over 125.   ketoconazole 2 % shampoo Commonly known as:  NIZORAL Apply 1 application topically daily.   lisinopril 30 MG tablet Commonly known as:  PRINIVIL,ZESTRIL Take 1 tablet by mouth daily.   omeprazole 20 MG capsule Commonly known as:  PRILOSEC Take 20 mg by mouth daily.   potassium chloride SA 20 MEQ tablet Commonly known as:  K-DUR,KLOR-CON Take 1 tablet (20 mEq total) by mouth daily. Start taking on:  07/19/2017   sertraline 50 MG tablet Commonly known as:  ZOLOFT Take 1 tablet by mouth daily.   tamsulosin 0.4 MG Caps capsule Commonly known as:  FLOMAX Take 0.4 mg by mouth daily.   Tiotropium Bromide-Olodaterol 2.5-2.5 MCG/ACT Aers Inhale 2 puffs into the lungs daily.       If you experience worsening of your admission symptoms, develop shortness of breath, life threatening emergency, suicidal or homicidal thoughts you must seek medical attention immediately by calling 911 or calling your MD immediately  if symptoms less severe.  You Must read complete instructions/literature along with all the possible adverse reactions/side effects for all the Medicines you take and that have been prescribed to you. Take any new Medicines after you have completely understood and accept all the possible adverse reactions/side effects.   Please note  You were cared for by a hospitalist during your hospital stay. If you have any questions about your discharge medications or the care you received while you were in the hospital after you are discharged, you can call the unit and asked to speak with the hospitalist on call if the hospitalist that took care of you is not available. Once you are discharged, your primary care physician will handle any further medical  issues. Please note that NO REFILLS for any discharge medications will be authorized once you are discharged, as it is imperative that you return to your primary care physician (or establish a relationship with a primary care physician if you do not have one) for your aftercare needs so that they can reassess your need for medications and monitor your lab values. Today   SUBJECTIVE   Doing well.  VITAL SIGNS:  Blood pressure (!) 126/49, pulse 71, temperature 98.7 F (37.1 C), temperature source Oral, resp. rate 18, height  (1.702 m), weight (!) 137.2 kg (302 lb 8 oz), SpO2 95 %.  I/O:    Intake/Output Summary (Last 24 hours) at 07/18/2017 1341 Last data filed at 07/18/2017 1338 Gross per 24 hour  Intake 960 ml  Output 2850 ml  Net -1890 ml    PHYSICAL EXAMINATION:  GENERAL:  72 y.o.-year-old patient lying in the bed with no acute distress. Obese EYES: Pupils equal, round, reactive to light and accommodation. No scleral icterus. Extraocular muscles intact.  HEENT: Head atraumatic,  normocephalic. Oropharynx and nasopharynx clear.  NECK:  Supple, no jugular venous distention. No thyroid enlargement, no tenderness.  LUNGS: distant breath sounds bilaterally, no wheezing, rales,rhonchi or crepitation. No use of accessory muscles of respiration.  CARDIOVASCULAR: S1, S2 normal. No murmurs, rubs, or gallops.  ABDOMEN: Soft, non-tender, non-distended. Bowel sounds present. No organomegaly or mass.  EXTREMITIES: No pedal edema, cyanosis, or clubbing.  NEUROLOGIC: Cranial nerves II through XII are intact. Muscle strength 5/5 in all extremities. Sensation intact. Gait not checked.  PSYCHIATRIC: The patient is alert and oriented x 3.  SKIN: No obvious rash, lesion, or ulcer.   DATA REVIEW:   CBC  Recent Labs  Lab 07/15/17 0424  WBC 6.0  HGB 10.6*  HCT 32.2*  PLT 180    Chemistries  Recent Labs  Lab 07/17/17 0551 07/18/17 0520  NA 137 136  K 4.0 4.0  CL 95* 93*  CO2 33* 33*   GLUCOSE 121* 134*  BUN 25* 32*  CREATININE 1.33* 1.50*  CALCIUM 8.9 9.0  MG 1.7  --     Microbiology Results   No results found for this or any previous visit (from the past 240 hour(s)).  RADIOLOGY:  No results found.   Management plans discussed with the patient, family and they are in agreement.  CODE STATUS:     Code Status Orders  (From admission, onward)        Start     Ordered   07/14/17 1521  Full code  Continuous     07/14/17 1520    Code Status History    This patient has a current code status but no historical code status.      TOTAL TIME TAKING CARE OF THIS PATIENT: *40* minutes.    Enedina Finner M.D on 07/18/2017 at 1:41 PM  Between 7am to 6pm - Pager - [phone] After 6pm go to [email or web address] - Social research officer, government  [place] [facility]  Office  [phone]  CC: Primary care physician; [facility], [facility]

## 2017-07-18 NOTE — Progress Notes (Signed)
Progress Note  Patient Name: Taylor Stephenson Date of Encounter: 07/18/2017  Primary Cardiologist: [facility]  Subjective   Napping with his Cpap this morning Has not spent much time out of bed Total of 16 L negative this admission Feels his abdomen is less tight, legs are back to baseline   Inpatient Medications    Scheduled Meds: . acidophilus  1 capsule Oral BID  . aspirin EC  81 mg Oral Daily  . atorvastatin  40 mg Oral QHS  . carvedilol  6.25 mg Oral BID WC  . furosemide  40 mg Oral BID  . heparin  5,000 Units Subcutaneous Q8H  . insulin aspart  0-5 Units Subcutaneous QHS  . insulin aspart  0-9 Units Subcutaneous TID WC  . insulin aspart  20 Units Subcutaneous TID WC  . insulin glargine  50 Units Subcutaneous BID  . lisinopril  10 mg Oral Daily  . pantoprazole  40 mg Oral Daily  . potassium chloride  20 mEq Oral Daily  . sertraline  50 mg Oral Daily  . sodium chloride flush  3 mL Intravenous Q12H  . tamsulosin  0.4 mg Oral Daily   Continuous Infusions: . sodium chloride     PRN Meds: sodium chloride, acetaminophen **OR** acetaminophen, albuterol, bisacodyl, HYDROcodone-acetaminophen, ondansetron **OR** ondansetron (ZOFRAN) IV, senna-docusate, sodium chloride flush   Vital Signs    Vitals:   07/17/17 1602 07/17/17 1956 07/18/17 0435 07/18/17 0805  BP: (!) 127/42 (!) 130/54 (!) 127/50 (!) 126/49  Pulse: 75 74 70 71  Resp:      Temp:  98.9 F (37.2 C) 98.6 F (37 C) 98.7 F (37.1 C)  TempSrc:  Oral Oral Oral  SpO2: 91% 93% 91% 95%  Weight:   (!) 302 lb 8 oz (137.2 kg)   Height:        Intake/Output Summary (Last 24 hours) at 07/18/2017 1324 Last data filed at 07/18/2017 1018 Gross per 24 hour  Intake 960 ml  Output 2050 ml  Net -1090 ml   Filed Weights   07/16/17 0500 07/17/17 0500 07/18/17 0435  Weight: (!) 316 lb 9.3 oz (143.6 kg) (!) 310 lb 1.6 oz (140.7 kg) (!) 302 lb 8 oz (137.2 kg)    Physical Exam   Constitutional:  oriented to person,  place, and time. No distress. obese HENT:  Head: Normocephalic and atraumatic.  Eyes:  no discharge. No scleral icterus.  Neck: Normal range of motion. Neck supple. No JVD present.  Cardiovascular: Normal rate, regular rhythm, normal heart sounds and intact distal pulses. Exam reveals no gallop and no friction rub. No edema No murmur heard. Pulmonary/Chest: Effort normal and breath sounds normal. No stridor. No respiratory distress.  no wheezes.   Rales at the bases ,no tenderness.  Abdominal: Soft.  no distension.  no tenderness.  Musculoskeletal: Normal range of motion.  no  tenderness or deformity.  Neurological:  normal muscle tone. Coordination normal. No atrophy Skin: Skin is warm and dry. No rash noted. not diaphoretic.  Psychiatric:  normal mood and affect. behavior is normal. Thought content normal.      Labs    Chemistry Recent Labs  Lab 07/16/17 0415 07/17/17 0551 07/18/17 0520  NA 137 137 136  K 3.8 4.0 4.0  CL 97* 95* 93*  CO2 32 33* 33*  GLUCOSE 112* 121* 134*  BUN 28* 25* 32*  CREATININE 1.35* 1.33* 1.50*  CALCIUM 8.8* 8.9 9.0  GFRNONAA 51* 52* 45*  GFRAA 59* >60  52*  ANIONGAP Hematology Recent Labs  Lab 07/14/17 0800 07/15/17 0424  WBC 6.4 6.0  RBC 3.51* 3.39*  HGB 11.1* 10.6*  HCT 34.1* 32.2*  MCV 97.0 95.1  MCH 31.7 31.4  MCHC 32.7 33.1  RDW 17.9* 16.9*  PLT 187 180    Cardiac Enzymes Recent Labs  Lab 07/14/17 0800 07/14/17 1628 07/14/17 2320 07/15/17 0424  TROPONINI <0.03 <0.03 0.03* 0.03*      BNP Recent Labs  Lab 07/14/17 0756  BNP 519.0*      Radiology    No results found.  Telemetry    RSR, 1st deg avb, freq PACs - Personally Reviewed  Cardiac Studies   2D Echocardiogram 5.8.2019  Study Conclusions   - Left ventricle: The cavity size was normal. There was moderate   concentric hypertrophy. Systolic function was normal. The   estimated ejection fraction was in the range of 55% to 60%. Wall    motion was normal; there were no regional wall motion   abnormalities. Features are consistent with a pseudonormal left   ventricular filling pattern, with concomitant abnormal relaxation   and increased filling pressure (grade 2 diastolic dysfunction). - Aortic valve: There was very mild stenosis. There was mild   regurgitation. - Mitral valve: Calcified annulus. Mildly thickened leaflets .   There was mild regurgitation. - Left atrium: The atrium was mildly dilated. - Right ventricle: The cavity size was moderately dilated. Wall   thickness was normal. Systolic function was moderately reduced. - Pulmonary arteries: Systolic pressure could not be accurately   estimated.   Patient Profile     72 y.o. 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  Assessment & Plan    1--acute on chronic diastolic CHF Poor diet, high fluid intake at home Does not weight himself -renal function starting to climb consistent with prerenal state Would discontinue IV Lasix changed to 40 by mouth twice a day starting tomorrow -Cardiac rehab Close follow-up in CHF clinic All of the above was discussed with him Discussed weighing daily and modifying his fluid intake, salt intake And to get him out of bed and walking walks with a walker  2) morbid obesity Likely contributing to above Dietary discretion, fluid indiscretion May benefit from meeting with nutritionist as outpatient  3) acute respiratory distress with hypoxia Secondary to acute  on chronic diastolic CHF, may be exacerbated by bioprosthetic aortic valve, morbid obesity, sleep apnea, Unable to exclude obesity hypoventilation syndrome He uses a CPAP at night and with napping Improved after aggressive diuresis  4) chronic  kidney disease Secondary to diabetes would hold off on IV Lasix and change to oral dosing  5) diabetes Poor diet, morbid obesity  nutrition consult as outpatient Dietary changes recommended discussed with him today  Today again discussed how he should monitor his weight, fluid intake at home, salt intake Needs to change his diet avoid take-out food and fast food Need close follow-up with CHF clinic Recommended when to call for further assistance such as 3 pound weight gain  Total encounter time more than 35 minutes  Greater than 50% was spent in counseling and coordination of care with the patient   Signed, Julien Nordmann, MD  07/18/2017, 1:24 PM  For questions or updates, please contact   Please consult [email or web address] for contact info under Cardiology/STEMI.

## 2017-07-18 NOTE — Progress Notes (Signed)
Pharmacy Electrolyte Monitoring Consult:  Pharmacy consulted to assist in monitoring and replacing electrolytes in this 72 y.o. male admitted on 07/14/2017 with acute on chronic CHF. Patient is currently on Lasix  IV q12h with KCl daily supplementation  5/12 K = 4.0  Labs:  Sodium (mmol/L)  Date Value  07/18/2017 136   Potassium (mmol/L)  Date Value  07/18/2017 4.0   Magnesium (mg/dL)  Date Value  16/12/9602 1.7   Calcium (mg/dL)  Date Value  54/11/8117 9.0   Albumin (g/dL)  Date Value  14/78/2956 3.9    Assessment/Plan: No additional supplementation is needed.  Will f/u AM labs.   Clovia Cuff, PharmD, BCPS 07/18/2017 10:26 AM

## 2017-07-18 NOTE — Progress Notes (Signed)
Point of correction, patient does not have a foley catheter. It's is a condom cath. No acute distress noted. Compliant with his CPAP.

## 2017-07-19 LAB — GLUCOSE, CAPILLARY
GLUCOSE-CAPILLARY: 145 mg/dL — AB (ref 65–99)
Glucose-Capillary: 78 mg/dL (ref 65–99)
Glucose-Capillary: 138 mg/dL — ABNORMAL HIGH (ref 65–99)

## 2017-07-21 ENCOUNTER — Telehealth: Payer: Self-pay

## 2017-07-21 NOTE — Telephone Encounter (Signed)
Flagged on EMMI report for not having a follow up scheduled.  1st attempt to reach patient made on 07/21/17 at 11:30am, however unable to reach.  Left voicemail encouraging callback.  Will attempt at a later time.

## 2017-07-22 NOTE — Telephone Encounter (Signed)
2nd attempt to reach patient made on 07/22/17 at 1:40pm, however unable to reach.  Left another voicemail encouraging callback.

## 2017-07-28 NOTE — Progress Notes (Signed)
Patient ID: Taylor Stephenson, male    DOB: [birth year], 72 y.o.   MRN: [id number]  HPI  Taylor Stephenson is a 71 y/o male with a history of vertigo, DM, HTN, CKD, obstructive sleep apnes (CPAP), aortic valve disease, previous tobacco use and chronic heart failure.   Echo report from 07/14/17 reviewed and showed an EF of 55-60% along with mild AS.   Admitted 07/14/17 due to acute on chronic HF due to too much fluid intake. Cardiology consult obtained. Initially needed IV lasix and then transitioned to oral diuretics with a net loss of 20L. Discharged after 4 days.  He presents today for his initial visit with a chief complaint of moderate shortness of breath upon minimal exertion. He describes this as chronic in nature having been present for several years. He has associated fatigue, occasional light-headedness and easy bruising along with this. He denies any difficulty sleeping, abdominal distention, palpitations, pedal edema, chest pain or weight gain.   Past Medical History:  Diagnosis Date  . Aortic valve disease    a. s/p bioprosthetic AVR in ~ 2014  . CHF (congestive heart failure) (HCC)   . CKD (chronic kidney disease), stage II   . Diabetes mellitus with complication (HCC)   . Hypertension   . Morbid obesity (HCC)   . OSA (obstructive sleep apnea)    a. on CPAP   Past Surgical History:  Procedure Laterality Date  . AORTA SURGERY    . APPENDECTOMY    . CARDIAC SURGERY     heart valve replaced   Family History  Problem Relation Age of Onset  . Diabetes Mother   . Diabetes Father   . Kidney disease Father   . Diabetes Sister    Social History   Tobacco Use  . Smoking status: Former Smoker    Types: Cigarettes  . Smokeless tobacco: Never Used  Substance Use Topics  . Alcohol use: Not on file   Allergies  Allergen Reactions  . Latex    Prior to Admission medications   Medication Sig Start Date End Date Taking? Authorizing Provider  acetaminophen (TYLENOL) 500 MG tablet  Take 500-1,000 mg by mouth every 6 (six) hours as needed.   Yes [provider]  acidophilus (RISAQUAD) CAPS capsule Take 1 capsule by mouth 2 (two) times daily.   Yes [provider]  albuterol (PROVENTIL HFA;VENTOLIN HFA) 108 (90 Base) MCG/ACT inhaler Inhale 2 puffs into the lungs every 6 (six) hours as needed for wheezing or shortness of breath.   Yes [provider]  ammonium lactate (LAC-HYDRIN) 12 % lotion Apply 1 application topically as needed for dry skin.   Yes [provider]  aspirin EC 81 MG tablet Take 81 mg by mouth daily.   Yes [provider]  atorvastatin (LIPITOR) 40 MG tablet Take 40 mg by mouth at bedtime.   Yes [provider]  carvedilol (COREG) 12.5 MG tablet Take 12.5 mg by mouth 2 (two) times daily with a meal.   Yes [provider]  clotrimazole (LOTRIMIN) 1 % external solution Apply 1 application topically 2 (two) times daily.   Yes [provider]  furosemide (LASIX) 40 MG tablet Take 1 tablet (40 mg total) by mouth 2 (two) times daily. 07/18/17  Yes Enedina Finner, MD  glucagon (GLUCAGON EMERGENCY) 1 MG injection Inject 1 mg into the vein once as needed (emergency hypoglycemia).   Yes [provider]  insulin glargine (LANTUS) 100 UNIT/ML injection Inject 50 Units  into the skin 2 (two) times daily.    Yes [provider]  insulin regular (NOVOLIN R,HUMULIN R) 100 units/mL injection Inject 20 Units into the skin 3 (three) times daily before meals. If blood sugar is over 125.   Yes [provider]  ketoconazole (NIZORAL) 2 % shampoo Apply 1 application topically daily.   Yes [provider]  lisinopril (PRINIVIL,ZESTRIL) 30 MG tablet Take 1 tablet by mouth daily.   Yes [provider]  omeprazole (PRILOSEC) 20 MG capsule Take 20 mg by mouth daily.   Yes [provider]  potassium chloride SA (K-DUR,KLOR-CON) 20 MEQ tablet Take 1 tablet (20 mEq total) by  mouth daily. 07/19/17  Yes Enedina Finner, MD  sertraline (ZOLOFT) 50 MG tablet Take 1 tablet by mouth daily.    Yes [provider]  tamsulosin (FLOMAX) 0.4 MG CAPS capsule Take 0.4 mg by mouth daily.   Yes [provider]  Tiotropium Bromide-Olodaterol 2.5-2.5 MCG/ACT AERS Inhale 2 puffs into the lungs daily.   Yes [provider]    Review of Systems  Constitutional: Positive for fatigue ("at times"). Negative for appetite change.  HENT: Positive for hearing loss. Negative for congestion, postnasal drip and sore throat.   Eyes: Negative.   Respiratory: Positive for shortness of breath (easily). Negative for chest tightness.   Cardiovascular: Negative for chest pain, palpitations and leg swelling.  Gastrointestinal: Negative for abdominal distention and abdominal pain.  Endocrine: Negative.   Genitourinary: Negative.   Musculoskeletal: Positive for arthralgias (knees/ hips hurt at times). Negative for back pain.  Skin: Negative.   Allergic/Immunologic: Negative.   Neurological: Positive for light-headedness (with vertigo). Negative for dizziness.  Hematological: Negative for adenopathy. Bruises/bleeds easily.  Psychiatric/Behavioral: Negative for dysphoric mood and sleep disturbance (wearing CPAP nightly). The patient is not nervous/anxious.    Vitals:   07/30/17 0911  BP: (!) 110/48  Pulse: (!) 52  Resp: 18  SpO2: 96%  Weight: (!) 309 lb 6 oz (140.3 kg)  Height:  (1.702 m)   Wt Readings from Last 3 Encounters:  07/30/17 (!) 309 lb 6 oz (140.3 kg)  07/18/17 (!) 302 lb 8 oz (137.2 kg)  09/27/15 (!) 320 lb (145.2 kg)   Lab Results  Component Value Date   CREATININE 1.50 (H) 07/18/2017   CREATININE 1.33 (H) 07/17/2017   CREATININE 1.35 (H) 07/16/2017   Physical Exam  Constitutional: He is oriented to person, place, and time. He appears well-developed and well-nourished.  HENT:  Head: Normocephalic and atraumatic.  Neck: Normal range of motion.  Neck supple. No JVD present.  Cardiovascular: Regular rhythm. Bradycardia present.  Pulmonary/Chest: Effort normal. No respiratory distress. He has no wheezes. He has no rales.  Abdominal: Soft. He exhibits no distension.  Musculoskeletal:       Right lower leg: He exhibits no tenderness and no edema.       Left lower leg: He exhibits no tenderness and no edema.  Neurological: He is alert and oriented to person, place, and time.  Skin: Skin is warm and dry.  Psychiatric: He has a normal mood and affect. His behavior is normal.  Nursing note and vitals reviewed.  Assessment & Plan:  1: Chronic heart failure with preserved ejection fraction- - NYHA class III - euvolemic today - weighing daily and home weight chart was reviewed. Reminded to call for an overnight weight gain of >2 pounds or a weekly weight gain of >5 pounds - not adding salt and  he has been reading food labels. Has stopped eating out at many places (chinese, [employer]) because of the sodium content of those foods. Reviewed the importance of closely following a  sodium diet and written dietary information was given to him about this. Low sodium cookbook was also given to him - unsure of exact fluid intake. Discussed the importance of measuring his fluid intake, including anything that is liquid at room temperature and keep daily fluid intake to 60-64 ounces daily - sees cardiology ([facility] at [facility]) - BNP 07/14/17 was 519.0 - PharmD reconciled medications with the patient  2: HTN- - BP looks good today - sees PCP (at [facility]) - BMP 07/18/17 reviewed and showed sodium 136, potassium 4.0 and GFR 45  3: DM-  - fasting glucose at home this morning was 134 - A1c on 07/14/17 was 7.5%  Medication list was reviewed.  Return in 1 month or sooner for any questions/problems before then.

## 2017-07-30 ENCOUNTER — Ambulatory Visit: Payer: Medicare Other | Attending: Family | Admitting: Family

## 2017-07-30 ENCOUNTER — Encounter: Payer: Self-pay | Admitting: Family

## 2017-07-30 DIAGNOSIS — Z87891 Personal history of nicotine dependence: Secondary | ICD-10-CM | POA: Insufficient documentation

## 2017-07-30 DIAGNOSIS — Z794 Long term (current) use of insulin: Secondary | ICD-10-CM | POA: Diagnosis not present

## 2017-07-30 DIAGNOSIS — E1122 Type 2 diabetes mellitus with diabetic chronic kidney disease: Secondary | ICD-10-CM | POA: Diagnosis not present

## 2017-07-30 DIAGNOSIS — Z6841 Body Mass Index (BMI) 40.0 and over, adult: Secondary | ICD-10-CM | POA: Insufficient documentation

## 2017-07-30 DIAGNOSIS — Z91040 Latex allergy status: Secondary | ICD-10-CM | POA: Insufficient documentation

## 2017-07-30 DIAGNOSIS — Z841 Family history of disorders of kidney and ureter: Secondary | ICD-10-CM | POA: Insufficient documentation

## 2017-07-30 DIAGNOSIS — E119 Type 2 diabetes mellitus without complications: Secondary | ICD-10-CM | POA: Insufficient documentation

## 2017-07-30 DIAGNOSIS — I5032 Chronic diastolic (congestive) heart failure: Secondary | ICD-10-CM | POA: Diagnosis present

## 2017-07-30 DIAGNOSIS — Z953 Presence of xenogenic heart valve: Secondary | ICD-10-CM | POA: Insufficient documentation

## 2017-07-30 DIAGNOSIS — Z79899 Other long term (current) drug therapy: Secondary | ICD-10-CM | POA: Diagnosis not present

## 2017-07-30 DIAGNOSIS — Z833 Family history of diabetes mellitus: Secondary | ICD-10-CM | POA: Insufficient documentation

## 2017-07-30 DIAGNOSIS — I130 Hypertensive heart and chronic kidney disease with heart failure and stage 1 through stage 4 chronic kidney disease, or unspecified chronic kidney disease: Secondary | ICD-10-CM | POA: Diagnosis not present

## 2017-07-30 DIAGNOSIS — Z7982 Long term (current) use of aspirin: Secondary | ICD-10-CM | POA: Diagnosis not present

## 2017-07-30 DIAGNOSIS — N183 Chronic kidney disease, stage 3 (moderate): Secondary | ICD-10-CM

## 2017-07-30 DIAGNOSIS — I10 Essential (primary) hypertension: Secondary | ICD-10-CM

## 2017-07-30 DIAGNOSIS — N182 Chronic kidney disease, stage 2 (mild): Secondary | ICD-10-CM | POA: Insufficient documentation

## 2017-07-30 DIAGNOSIS — G4733 Obstructive sleep apnea (adult) (pediatric): Secondary | ICD-10-CM | POA: Insufficient documentation

## 2017-07-30 NOTE — Patient Instructions (Addendum)
Continue weighing daily and call for an overnight weight gain of > 2 pounds or a weekly weight gain of >5 pounds.  Read food labels and keep daily sodium intake to 2000mg  daily  Keep fluid intake to 60-64 ounces daily

## 2017-09-08 ENCOUNTER — Encounter: Payer: Self-pay | Admitting: Family

## 2017-09-08 ENCOUNTER — Ambulatory Visit: Payer: Medicare Other | Attending: Family | Admitting: Family

## 2017-09-08 VITALS — BP 121/46 | HR 65 | Resp 18 | Ht 67.0 in | Wt 309.0 lb

## 2017-09-08 DIAGNOSIS — Z953 Presence of xenogenic heart valve: Secondary | ICD-10-CM | POA: Diagnosis not present

## 2017-09-08 DIAGNOSIS — N182 Chronic kidney disease, stage 2 (mild): Secondary | ICD-10-CM | POA: Insufficient documentation

## 2017-09-08 DIAGNOSIS — Z87891 Personal history of nicotine dependence: Secondary | ICD-10-CM | POA: Insufficient documentation

## 2017-09-08 DIAGNOSIS — N183 Chronic kidney disease, stage 3 unspecified: Secondary | ICD-10-CM

## 2017-09-08 DIAGNOSIS — E1122 Type 2 diabetes mellitus with diabetic chronic kidney disease: Secondary | ICD-10-CM | POA: Diagnosis not present

## 2017-09-08 DIAGNOSIS — Z794 Long term (current) use of insulin: Secondary | ICD-10-CM | POA: Diagnosis not present

## 2017-09-08 DIAGNOSIS — Z6841 Body Mass Index (BMI) 40.0 and over, adult: Secondary | ICD-10-CM | POA: Insufficient documentation

## 2017-09-08 DIAGNOSIS — Z7982 Long term (current) use of aspirin: Secondary | ICD-10-CM | POA: Diagnosis not present

## 2017-09-08 DIAGNOSIS — G4733 Obstructive sleep apnea (adult) (pediatric): Secondary | ICD-10-CM | POA: Diagnosis not present

## 2017-09-08 DIAGNOSIS — I890 Lymphedema, not elsewhere classified: Secondary | ICD-10-CM | POA: Diagnosis not present

## 2017-09-08 DIAGNOSIS — I5032 Chronic diastolic (congestive) heart failure: Secondary | ICD-10-CM | POA: Diagnosis not present

## 2017-09-08 DIAGNOSIS — I130 Hypertensive heart and chronic kidney disease with heart failure and stage 1 through stage 4 chronic kidney disease, or unspecified chronic kidney disease: Secondary | ICD-10-CM | POA: Insufficient documentation

## 2017-09-08 DIAGNOSIS — I10 Essential (primary) hypertension: Secondary | ICD-10-CM

## 2017-09-08 DIAGNOSIS — Z79899 Other long term (current) drug therapy: Secondary | ICD-10-CM | POA: Diagnosis not present

## 2017-09-08 NOTE — Patient Instructions (Signed)
Continue weighing daily and call for an overnight weight gain of > 2 pounds or a weekly weight gain of >5 pounds. 

## 2017-09-08 NOTE — Progress Notes (Signed)
Patient ID: Taylor Stephenson, male    DOB: 06/22/1945, 72 y.o.   MRN: [id number]  HPI  Taylor Stephenson is a 72 y/o male with a history of vertigo, DM, HTN, CKD, obstructive sleep apnes (CPAP), aortic valve disease, previous tobacco use and chronic heart failure.   Echo report from 07/14/17 reviewed and showed an EF of 55-60% along with mild AS.   Admitted 07/14/17 due to acute on chronic HF due to too much fluid intake. Cardiology consult obtained. Initially needed IV lasix and then transitioned to oral diuretics with a net loss of 20L. Discharged after 4 days.  He presents today for a follow-up visit with a chief complaint of moderate fatigue upon minimal exertion. He says that this has been chronic in nature having been present for several years. He has associated light-headedness, shortness of breath, wheezing, pedal edema and chronic back pain along with this. He denies any difficulty sleeping, abdominal distention, palpitations, chest pain or weight gain. Has recently joined the [facility] and has been a couple of times to use the pool.   Past Medical History:  Diagnosis Date  . Aortic valve disease    a. s/p bioprosthetic AVR in ~ 2014  . CHF (congestive heart failure) (HCC)   . CKD (chronic kidney disease), stage II   . Diabetes mellitus with complication (HCC)   . Hypertension   . Morbid obesity (HCC)   . OSA (obstructive sleep apnea)    a. on CPAP   Past Surgical History:  Procedure Laterality Date  . AORTA SURGERY    . APPENDECTOMY    . CARDIAC SURGERY     heart valve replaced   Family History  Problem Relation Age of Onset  . Diabetes Mother   . Diabetes Father   . Kidney disease Father   . Diabetes Sister    Social History   Tobacco Use  . Smoking status: Former Smoker    Types: Cigarettes  . Smokeless tobacco: Never Used  Substance Use Topics  . Alcohol use: Not on file   Allergies  Allergen Reactions  . Latex    Prior to Admission medications   Medication Sig Start  Date End Date Taking? Authorizing Provider  acetaminophen (TYLENOL) 500 MG tablet Take 500-1,000 mg by mouth every 6 (six) hours as needed.   Yes [provider]  acidophilus (RISAQUAD) CAPS capsule Take 1 capsule by mouth 2 (two) times daily.   Yes [provider]  albuterol (PROVENTIL HFA;VENTOLIN HFA) 108 (90 Base) MCG/ACT inhaler Inhale 2 puffs into the lungs every 6 (six) hours as needed for wheezing or shortness of breath.   Yes [provider]  ammonium lactate (LAC-HYDRIN) 12 % lotion Apply 1 application topically as needed for dry skin.   Yes [provider]  aspirin EC 81 MG tablet Take 81 mg by mouth daily.   Yes [provider]  atorvastatin (LIPITOR) 40 MG tablet Take 40 mg by mouth at bedtime.   Yes [provider]  carvedilol (COREG) 12.5 MG tablet Take 12.5 mg by mouth 2 (two) times daily with a meal.   Yes [provider]  clotrimazole (LOTRIMIN) 1 % external solution Apply 1 application topically 2 (two) times daily.   Yes [provider]  furosemide (LASIX) 40 MG tablet Take 1 tablet (40 mg total) by mouth 2 (two) times daily. 07/18/17  Yes Enedina Finner, MD  glucagon (GLUCAGON EMERGENCY) 1 MG injection Inject 1 mg into the vein once as  needed (emergency hypoglycemia).   Yes [provider]  insulin glargine (LANTUS) 100 UNIT/ML injection Inject 50 Units into the skin 2 (two) times daily.    Yes [provider]  insulin regular (NOVOLIN R,HUMULIN R) 100 units/mL injection Inject 20 Units into the skin 3 (three) times daily before meals. If blood sugar is over 125.   Yes [provider]  ketoconazole (NIZORAL) 2 % shampoo Apply 1 application topically daily.   Yes [provider]  lisinopril (PRINIVIL,ZESTRIL) 30 MG tablet Take 1 tablet by mouth daily.   Yes [provider]  omeprazole (PRILOSEC) 20 MG capsule Take 20 mg by mouth daily.   Yes [provider]   potassium chloride SA (K-DUR,KLOR-CON) 20 MEQ tablet Take 1 tablet (20 mEq total) by mouth daily. 07/19/17  Yes Enedina FinnerPatel, Sona, MD  sertraline (ZOLOFT) 50 MG tablet Take 1 tablet by mouth daily.    Yes [provider]  tamsulosin (FLOMAX) 0.4 MG CAPS capsule Take 0.4 mg by mouth daily.   Yes [provider]  Tiotropium Bromide-Olodaterol 2.5-2.5 MCG/ACT AERS Inhale 2 puffs into the lungs daily.   Yes [provider]    Review of Systems  Constitutional: Positive for fatigue (with little exertion). Negative for appetite change.  HENT: Positive for hearing loss. Negative for congestion and postnasal drip.   Eyes: Negative.   Respiratory: Positive for shortness of breath and wheezing (occasionally). Negative for chest tightness.   Cardiovascular: Positive for leg swelling. Negative for chest pain and palpitations.  Gastrointestinal: Negative for abdominal distention and abdominal pain.  Endocrine: Negative.   Genitourinary: Negative.   Musculoskeletal: Positive for arthralgias (knees/ hips hurt at times) and back pain (at times).  Skin: Negative.   Allergic/Immunologic: Negative.   Neurological: Positive for light-headedness (with vertigo). Negative for dizziness.  Hematological: Negative for adenopathy. Bruises/bleeds easily.  Psychiatric/Behavioral: Negative for dysphoric mood and sleep disturbance (wearing CPAP nightly). The patient is not nervous/anxious.    Vitals:   09/08/17 1042  BP: (!) 121/46  Pulse: 65  Resp: 18  SpO2: 95%  Weight: (!) 309 lb (140.2 kg)  Height: 5\' 7"  (1.702 m)   Wt Readings from Last 3 Encounters:  09/08/17 (!) 309 lb (140.2 kg)  07/30/17 (!) 309 lb 6 oz (140.3 kg)  07/18/17 (!) 302 lb 8 oz (137.2 kg)   Lab Results  Component Value Date   CREATININE 1.50 (H) 07/18/2017   CREATININE 1.33 (H) 07/17/2017   CREATININE 1.35 (H) 07/16/2017   Physical Exam  Constitutional: He is oriented to person, place, and time. He appears  well-developed and well-nourished.  HENT:  Head: Normocephalic and atraumatic.  Neck: Normal range of motion. Neck supple. No JVD present.  Cardiovascular: Normal rate and regular rhythm.  Pulmonary/Chest: Effort normal. No respiratory distress. He has no wheezes. He has no rales.  Abdominal: Soft. He exhibits no distension.  Musculoskeletal:       Right lower leg: He exhibits edema (trace pitting). He exhibits no tenderness.       Left lower leg: He exhibits edema (trace pitting). He exhibits no tenderness.  Neurological: He is alert and oriented to person, place, and time.  Skin: Skin is warm and dry.  Psychiatric: He has a normal mood and affect. His behavior is normal.  Nursing note and vitals reviewed.  Assessment & Plan:  1: Chronic heart failure with preserved ejection fraction- - NYHA class III - minimal fluid overload today - weighing daily and home weight  chart was reviewed. Reminded to call for an overnight weight gain of >2 pounds or a weekly weight gain of >5 pounds - weight stable since he was last here 6 weeks ago - not adding salt and he has been reading food labels. Has stopped eating out at many places (chinese, [employer]) because of the sodium content of those foods. Reviewed the importance of closely following a 2000mg  sodium diet  - trying to keep daily fluid intake to 60-64 ounces daily - sees cardiology ([facility]affor at [facility]) - BNP 07/14/17 was 519.0 - PharmD reconciled medications with the patient - encouraged him to continue using the pool at the [facility]  2: HTN- - BP looks good today - sees PCP (at [facility]) - BMP 07/18/17 reviewed and showed sodium 136, potassium 4.0 and GFR 45  3: DM-  - fasting glucose at home this morning was 138 - A1c on 07/14/17 was 7.5%  4: Lymphedema-  - stage 2 - limited in his ability to exercise due to chronic back pain - does elevate legs but says that even in the morning after having them elevated all night, they are still  swollen - encouraged him to elevate his legs when sitting for long periods of time - consider lymphapress compression boots if edema persists  Medication list was reviewed.  Return in 4 months or sooner for any questions/ problems before then.

## 2017-09-09 ENCOUNTER — Encounter: Payer: Self-pay | Admitting: Family

## 2017-09-09 DIAGNOSIS — I890 Lymphedema, not elsewhere classified: Secondary | ICD-10-CM | POA: Insufficient documentation

## 2017-09-20 ENCOUNTER — Inpatient Hospital Stay
Admission: EM | Admit: 2017-09-20 | Discharge: 2017-09-24 | DRG: 291 | Disposition: A | Discharge status: Home Health | Payer: Medicare Other | Attending: Internal Medicine | Admitting: Internal Medicine

## 2017-09-20 ENCOUNTER — Emergency Department: Payer: Medicare Other

## 2017-09-20 ENCOUNTER — Other Ambulatory Visit: Payer: Self-pay

## 2017-09-20 DIAGNOSIS — I130 Hypertensive heart and chronic kidney disease with heart failure and stage 1 through stage 4 chronic kidney disease, or unspecified chronic kidney disease: Principal | ICD-10-CM | POA: Diagnosis present

## 2017-09-20 DIAGNOSIS — Z7982 Long term (current) use of aspirin: Secondary | ICD-10-CM

## 2017-09-20 DIAGNOSIS — E785 Hyperlipidemia, unspecified: Secondary | ICD-10-CM | POA: Diagnosis present

## 2017-09-20 DIAGNOSIS — N400 Enlarged prostate without lower urinary tract symptoms: Secondary | ICD-10-CM | POA: Diagnosis present

## 2017-09-20 DIAGNOSIS — I509 Heart failure, unspecified: Secondary | ICD-10-CM

## 2017-09-20 DIAGNOSIS — Z6841 Body Mass Index (BMI) 40.0 and over, adult: Secondary | ICD-10-CM

## 2017-09-20 DIAGNOSIS — I440 Atrioventricular block, first degree: Secondary | ICD-10-CM | POA: Diagnosis present

## 2017-09-20 DIAGNOSIS — Z91040 Latex allergy status: Secondary | ICD-10-CM

## 2017-09-20 DIAGNOSIS — Z87891 Personal history of nicotine dependence: Secondary | ICD-10-CM

## 2017-09-20 DIAGNOSIS — E11649 Type 2 diabetes mellitus with hypoglycemia without coma: Secondary | ICD-10-CM | POA: Diagnosis not present

## 2017-09-20 DIAGNOSIS — R0902 Hypoxemia: Secondary | ICD-10-CM | POA: Diagnosis present

## 2017-09-20 DIAGNOSIS — Z953 Presence of xenogenic heart valve: Secondary | ICD-10-CM

## 2017-09-20 DIAGNOSIS — F329 Major depressive disorder, single episode, unspecified: Secondary | ICD-10-CM | POA: Diagnosis present

## 2017-09-20 DIAGNOSIS — N183 Chronic kidney disease, stage 3 (moderate): Secondary | ICD-10-CM | POA: Diagnosis present

## 2017-09-20 DIAGNOSIS — E1122 Type 2 diabetes mellitus with diabetic chronic kidney disease: Secondary | ICD-10-CM | POA: Diagnosis present

## 2017-09-20 DIAGNOSIS — D638 Anemia in other chronic diseases classified elsewhere: Secondary | ICD-10-CM | POA: Diagnosis present

## 2017-09-20 DIAGNOSIS — G4733 Obstructive sleep apnea (adult) (pediatric): Secondary | ICD-10-CM | POA: Diagnosis present

## 2017-09-20 DIAGNOSIS — Z794 Long term (current) use of insulin: Secondary | ICD-10-CM

## 2017-09-20 DIAGNOSIS — J449 Chronic obstructive pulmonary disease, unspecified: Secondary | ICD-10-CM | POA: Diagnosis present

## 2017-09-20 DIAGNOSIS — Z79899 Other long term (current) drug therapy: Secondary | ICD-10-CM

## 2017-09-20 DIAGNOSIS — I4510 Unspecified right bundle-branch block: Secondary | ICD-10-CM | POA: Diagnosis present

## 2017-09-20 DIAGNOSIS — K5900 Constipation, unspecified: Secondary | ICD-10-CM | POA: Diagnosis not present

## 2017-09-20 DIAGNOSIS — Z9989 Dependence on other enabling machines and devices: Secondary | ICD-10-CM

## 2017-09-20 DIAGNOSIS — I5033 Acute on chronic diastolic (congestive) heart failure: Secondary | ICD-10-CM | POA: Diagnosis present

## 2017-09-20 LAB — CBC
HCT: 31.2 % — ABNORMAL LOW (ref 40.0–52.0)
Hemoglobin: 10.6 g/dL — ABNORMAL LOW (ref 13.0–18.0)
MCH: 31.2 pg (ref 26.0–34.0)
MCHC: 34.2 g/dL (ref 32.0–36.0)
MCV: 91.4 fL (ref 80.0–100.0)
Platelets: 180 10*3/uL (ref 150–440)
RBC: 3.41 MIL/uL — ABNORMAL LOW (ref 4.40–5.90)
RDW: 18.3 % — ABNORMAL HIGH (ref 11.5–14.5)
WBC: 8 10*3/uL (ref 3.8–10.6)

## 2017-09-20 LAB — BASIC METABOLIC PANEL
Anion gap: 10 (ref 5–15)
BUN: 41 mg/dL — ABNORMAL HIGH (ref 8–23)
CO2: 19 mmol/L — ABNORMAL LOW (ref 22–32)
Calcium: 8.6 mg/dL — ABNORMAL LOW (ref 8.9–10.3)
Chloride: 104 mmol/L (ref 98–111)
Creatinine, Ser: 1.65 mg/dL — ABNORMAL HIGH (ref 0.61–1.24)
GFR calc Af Amer: 46 mL/min — ABNORMAL LOW (ref >60)
GFR calc non Af Amer: 40 mL/min — ABNORMAL LOW (ref >60)
Glucose, Bld: 176 mg/dL — ABNORMAL HIGH (ref 70–99)
Potassium: 4.8 mmol/L (ref 3.5–5.1)
Sodium: 133 mmol/L — ABNORMAL LOW (ref 135–145)

## 2017-09-20 LAB — TROPONIN I: Troponin I: <0.03 ng/mL (ref <0.03)

## 2017-09-20 MED ORDER — METHYLPREDNISOLONE SODIUM SUCC 125 MG IJ SOLR
125.0000 mg | Freq: Once | INTRAMUSCULAR | Status: DC
  Filled 2017-09-20: qty 2

## 2017-09-20 MED ORDER — METHYLPREDNISOLONE SODIUM SUCC 125 MG IJ SOLR
125.0000 mg | Freq: Once | INTRAMUSCULAR | Status: AC
  Administered 2017-09-20: 125 mg via INTRAVENOUS

## 2017-09-20 MED ORDER — ALBUTEROL SULFATE (2.5 MG/3ML) 0.083% IN NEBU
2.5000 mg | INHALATION_SOLUTION | Freq: Once | RESPIRATORY_TRACT | Status: AC
  Administered 2017-09-20: 2.5 mg via RESPIRATORY_TRACT
  Filled 2017-09-20: qty 3

## 2017-09-20 NOTE — ED Triage Notes (Signed)
Pt arrives to ED via [facility] from home with c/o bilateral leg swelling x1 week. Pt reports h/x of CHF, states compliance with diuretic medications. Pt denies CP but reports mild SHOB. Pt is A&O, in NAD; RR even, regular, and unlabored; skin color/temp is WNL.

## 2017-09-21 ENCOUNTER — Other Ambulatory Visit: Payer: Self-pay

## 2017-09-21 DIAGNOSIS — Z953 Presence of xenogenic heart valve: Secondary | ICD-10-CM | POA: Diagnosis not present

## 2017-09-21 DIAGNOSIS — K5900 Constipation, unspecified: Secondary | ICD-10-CM | POA: Diagnosis not present

## 2017-09-21 DIAGNOSIS — F329 Major depressive disorder, single episode, unspecified: Secondary | ICD-10-CM | POA: Diagnosis present

## 2017-09-21 DIAGNOSIS — Z87891 Personal history of nicotine dependence: Secondary | ICD-10-CM | POA: Diagnosis not present

## 2017-09-21 DIAGNOSIS — J449 Chronic obstructive pulmonary disease, unspecified: Secondary | ICD-10-CM | POA: Diagnosis present

## 2017-09-21 DIAGNOSIS — Z6841 Body Mass Index (BMI) 40.0 and over, adult: Secondary | ICD-10-CM | POA: Diagnosis not present

## 2017-09-21 DIAGNOSIS — I440 Atrioventricular block, first degree: Secondary | ICD-10-CM | POA: Diagnosis present

## 2017-09-21 DIAGNOSIS — E1165 Type 2 diabetes mellitus with hyperglycemia: Secondary | ICD-10-CM | POA: Diagnosis not present

## 2017-09-21 DIAGNOSIS — I509 Heart failure, unspecified: Secondary | ICD-10-CM | POA: Diagnosis not present

## 2017-09-21 DIAGNOSIS — E1122 Type 2 diabetes mellitus with diabetic chronic kidney disease: Secondary | ICD-10-CM | POA: Diagnosis present

## 2017-09-21 DIAGNOSIS — D638 Anemia in other chronic diseases classified elsewhere: Secondary | ICD-10-CM | POA: Diagnosis present

## 2017-09-21 DIAGNOSIS — R0902 Hypoxemia: Secondary | ICD-10-CM | POA: Diagnosis present

## 2017-09-21 DIAGNOSIS — Z952 Presence of prosthetic heart valve: Secondary | ICD-10-CM | POA: Diagnosis not present

## 2017-09-21 DIAGNOSIS — G4733 Obstructive sleep apnea (adult) (pediatric): Secondary | ICD-10-CM | POA: Diagnosis present

## 2017-09-21 DIAGNOSIS — I4510 Unspecified right bundle-branch block: Secondary | ICD-10-CM | POA: Diagnosis present

## 2017-09-21 DIAGNOSIS — Z7982 Long term (current) use of aspirin: Secondary | ICD-10-CM | POA: Diagnosis not present

## 2017-09-21 DIAGNOSIS — Z794 Long term (current) use of insulin: Secondary | ICD-10-CM | POA: Diagnosis not present

## 2017-09-21 DIAGNOSIS — I130 Hypertensive heart and chronic kidney disease with heart failure and stage 1 through stage 4 chronic kidney disease, or unspecified chronic kidney disease: Secondary | ICD-10-CM | POA: Diagnosis present

## 2017-09-21 DIAGNOSIS — Z9989 Dependence on other enabling machines and devices: Secondary | ICD-10-CM | POA: Diagnosis not present

## 2017-09-21 DIAGNOSIS — N183 Chronic kidney disease, stage 3 (moderate): Secondary | ICD-10-CM | POA: Diagnosis present

## 2017-09-21 DIAGNOSIS — I5033 Acute on chronic diastolic (congestive) heart failure: Secondary | ICD-10-CM

## 2017-09-21 DIAGNOSIS — D631 Anemia in chronic kidney disease: Secondary | ICD-10-CM | POA: Diagnosis not present

## 2017-09-21 DIAGNOSIS — E11649 Type 2 diabetes mellitus with hypoglycemia without coma: Secondary | ICD-10-CM | POA: Diagnosis not present

## 2017-09-21 DIAGNOSIS — N400 Enlarged prostate without lower urinary tract symptoms: Secondary | ICD-10-CM | POA: Diagnosis present

## 2017-09-21 DIAGNOSIS — Z91040 Latex allergy status: Secondary | ICD-10-CM | POA: Diagnosis not present

## 2017-09-21 DIAGNOSIS — Z79899 Other long term (current) drug therapy: Secondary | ICD-10-CM | POA: Diagnosis not present

## 2017-09-21 DIAGNOSIS — E785 Hyperlipidemia, unspecified: Secondary | ICD-10-CM | POA: Diagnosis present

## 2017-09-21 DIAGNOSIS — E118 Type 2 diabetes mellitus with unspecified complications: Secondary | ICD-10-CM | POA: Diagnosis not present

## 2017-09-21 LAB — GLUCOSE, CAPILLARY
Glucose-Capillary: 145 mg/dL — ABNORMAL HIGH (ref 70–99)
Glucose-Capillary: 268 mg/dL — ABNORMAL HIGH (ref 70–99)
Glucose-Capillary: 313 mg/dL — ABNORMAL HIGH (ref 70–99)
Glucose-Capillary: 328 mg/dL — ABNORMAL HIGH (ref 70–99)
Glucose-Capillary: 333 mg/dL — ABNORMAL HIGH (ref 70–99)

## 2017-09-21 LAB — TSH: TSH: 3.031 u[IU]/mL (ref 0.350–4.500)

## 2017-09-21 LAB — BRAIN NATRIURETIC PEPTIDE: B NATRIURETIC PEPTIDE 5: 555 pg/mL — AB (ref 0.0–100.0)

## 2017-09-21 MED ORDER — ONDANSETRON HCL 4 MG/2ML IJ SOLN: 4.0000 mg | Freq: Four times a day (QID) | INTRAMUSCULAR | Status: DC | PRN

## 2017-09-21 MED ORDER — TAMSULOSIN HCL 0.4 MG PO CAPS
0.4000 mg | ORAL_CAPSULE | Freq: Every day | ORAL | Status: DC
  Administered 2017-09-21 – 2017-09-24 (×4): 0.4 mg via ORAL
  Filled 2017-09-21 (×4): qty 1

## 2017-09-21 MED ORDER — AMMONIUM LACTATE 12 % EX LOTN
1.0000 "application " | TOPICAL_LOTION | CUTANEOUS | Status: DC | PRN
  Filled 2017-09-21: qty 400

## 2017-09-21 MED ORDER — POTASSIUM CHLORIDE CRYS ER 20 MEQ PO TBCR: 20.0000 meq | EXTENDED_RELEASE_TABLET | Freq: Every day | ORAL | Status: DC

## 2017-09-21 MED ORDER — FUROSEMIDE 10 MG/ML IJ SOLN
40.0000 mg | Freq: Once | INTRAMUSCULAR | Status: AC
  Administered 2017-09-21: 40 mg via INTRAVENOUS
  Filled 2017-09-21: qty 4

## 2017-09-21 MED ORDER — INSULIN GLARGINE 100 UNIT/ML ~~LOC~~ SOLN
60.0000 [IU] | Freq: Every day | SUBCUTANEOUS | Status: DC
  Administered 2017-09-21: 60 [IU] via SUBCUTANEOUS
  Filled 2017-09-21 (×2): qty 0.6

## 2017-09-21 MED ORDER — ACETAMINOPHEN 650 MG RE SUPP: 650.0000 mg | Freq: Four times a day (QID) | RECTAL | Status: DC | PRN

## 2017-09-21 MED ORDER — ATORVASTATIN CALCIUM 20 MG PO TABS
40.0000 mg | ORAL_TABLET | Freq: Every day | ORAL | Status: DC
  Administered 2017-09-21 – 2017-09-23 (×3): 40 mg via ORAL
  Filled 2017-09-21 (×3): qty 2

## 2017-09-21 MED ORDER — FLUTICASONE FUROATE-VILANTEROL 200-25 MCG/INH IN AEPB
1.0000 | INHALATION_SPRAY | Freq: Every day | RESPIRATORY_TRACT | Status: DC
  Administered 2017-09-21 – 2017-09-24 (×4): 1 via RESPIRATORY_TRACT
  Filled 2017-09-21: qty 28

## 2017-09-21 MED ORDER — PANTOPRAZOLE SODIUM 40 MG PO TBEC
40.0000 mg | DELAYED_RELEASE_TABLET | Freq: Every day | ORAL | Status: DC
  Administered 2017-09-21 – 2017-09-24 (×4): 40 mg via ORAL
  Filled 2017-09-21 (×4): qty 1

## 2017-09-21 MED ORDER — FUROSEMIDE 10 MG/ML IJ SOLN
80.0000 mg | Freq: Two times a day (BID) | INTRAMUSCULAR | Status: DC
  Administered 2017-09-21 – 2017-09-22 (×2): 80 mg via INTRAVENOUS
  Filled 2017-09-21 (×2): qty 8

## 2017-09-21 MED ORDER — SERTRALINE HCL 50 MG PO TABS
50.0000 mg | ORAL_TABLET | Freq: Every day | ORAL | Status: DC
  Administered 2017-09-21 – 2017-09-24 (×4): 50 mg via ORAL
  Filled 2017-09-21 (×4): qty 1

## 2017-09-21 MED ORDER — LISINOPRIL 20 MG PO TABS
30.0000 mg | ORAL_TABLET | Freq: Every day | ORAL | Status: DC
  Administered 2017-09-21: 30 mg via ORAL
  Filled 2017-09-21: qty 1

## 2017-09-21 MED ORDER — DOCUSATE SODIUM 100 MG PO CAPS
100.0000 mg | ORAL_CAPSULE | Freq: Two times a day (BID) | ORAL | Status: DC
  Administered 2017-09-21 – 2017-09-24 (×7): 100 mg via ORAL
  Filled 2017-09-21 (×7): qty 1

## 2017-09-21 MED ORDER — RISAQUAD PO CAPS
1.0000 | ORAL_CAPSULE | Freq: Every day | ORAL | Status: DC
  Administered 2017-09-21 – 2017-09-24 (×4): 1 via ORAL
  Filled 2017-09-21 (×4): qty 1

## 2017-09-21 MED ORDER — HEPARIN SODIUM (PORCINE) 5000 UNIT/ML IJ SOLN
5000.0000 [IU] | Freq: Three times a day (TID) | INTRAMUSCULAR | Status: DC
  Administered 2017-09-21 – 2017-09-24 (×10): 5000 [IU] via SUBCUTANEOUS
  Filled 2017-09-21 (×11): qty 1

## 2017-09-21 MED ORDER — INSULIN GLARGINE 100 UNIT/ML ~~LOC~~ SOLN
20.0000 [IU] | Freq: Once | SUBCUTANEOUS | Status: DC
  Filled 2017-09-21: qty 0.2

## 2017-09-21 MED ORDER — ASPIRIN EC 81 MG PO TBEC
81.0000 mg | DELAYED_RELEASE_TABLET | Freq: Every day | ORAL | Status: DC
  Administered 2017-09-21 – 2017-09-24 (×4): 81 mg via ORAL
  Filled 2017-09-21 (×4): qty 1

## 2017-09-21 MED ORDER — INSULIN ASPART 100 UNIT/ML ~~LOC~~ SOLN
0.0000 [IU] | Freq: Three times a day (TID) | SUBCUTANEOUS | Status: DC
  Administered 2017-09-21: 11 [IU] via SUBCUTANEOUS
  Administered 2017-09-21: 8 [IU] via SUBCUTANEOUS
  Administered 2017-09-21: 11 [IU] via SUBCUTANEOUS
  Administered 2017-09-22 – 2017-09-23 (×3): 8 [IU] via SUBCUTANEOUS
  Administered 2017-09-23: 3 [IU] via SUBCUTANEOUS
  Administered 2017-09-23: 8 [IU] via SUBCUTANEOUS
  Administered 2017-09-24: 5 [IU] via SUBCUTANEOUS
  Administered 2017-09-24: 3 [IU] via SUBCUTANEOUS
  Filled 2017-09-21 (×10): qty 1

## 2017-09-21 MED ORDER — ACETAMINOPHEN 325 MG PO TABS
650.0000 mg | ORAL_TABLET | Freq: Four times a day (QID) | ORAL | Status: DC | PRN
  Administered 2017-09-23: 650 mg via ORAL
  Filled 2017-09-21: qty 2

## 2017-09-21 MED ORDER — FUROSEMIDE 10 MG/ML IJ SOLN: 80.0000 mg | Freq: Four times a day (QID) | INTRAMUSCULAR | Status: DC

## 2017-09-21 MED ORDER — CARVEDILOL 12.5 MG PO TABS
12.5000 mg | ORAL_TABLET | Freq: Two times a day (BID) | ORAL | Status: DC
  Administered 2017-09-21: 12.5 mg via ORAL
  Filled 2017-09-21 (×2): qty 1

## 2017-09-21 MED ORDER — INSULIN GLARGINE 100 UNIT/ML ~~LOC~~ SOLN
50.0000 [IU] | Freq: Every day | SUBCUTANEOUS | Status: DC
  Filled 2017-09-21: qty 0.5

## 2017-09-21 MED ORDER — ONDANSETRON HCL 4 MG PO TABS: 4.0000 mg | ORAL_TABLET | Freq: Four times a day (QID) | ORAL | Status: DC | PRN

## 2017-09-21 MED ORDER — FUROSEMIDE 10 MG/ML IJ SOLN
80.0000 mg | Freq: Three times a day (TID) | INTRAMUSCULAR | Status: DC
  Administered 2017-09-21: 80 mg via INTRAVENOUS
  Filled 2017-09-21: qty 8

## 2017-09-21 NOTE — Plan of Care (Signed)
  Problem: Education: Goal: Knowledge of General Education information will improve Outcome: Progressing   Problem: Health Behavior/Discharge Planning: Goal: Ability to manage health-related needs will improve Outcome: Progressing   Problem: Safety: Goal: Ability to remain free from injury will improve Outcome: Progressing   

## 2017-09-21 NOTE — ED Notes (Signed)
Ambulatory assessment with SAT, O2 sat prior to ambulating was 95 room air, pt assisted to bathroom with increased work of breathing. Pt saturated with urine, pt cleansed and provided clean dry under garments. Ambulatory sat with the use of a walker decreased to 90% on room air, pt with an increased work of breathing noted.

## 2017-09-21 NOTE — Consult Note (Signed)
Cardiology Consultation:   Patient ID: Taylor Stephenson; [id number]; 1945-05-23   Admit date: 09/20/2017 Date of Consult: 09/21/2017  Primary Care Provider: [facility], [facility] Primary Cardiologist: [facility] (seen by Dr. Mariah MillingGollan during admission 07/2017)  Patient Profile:   Taylor Stephenson is a 72 y.o. 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 Sheryle Hailiamond.  History of Present Illness:   Taylor Stephenson is followed by the [facility]. He reports undergoing bioprosthetic AVR in 2014 with a LHC prior to that showing nonobstructive disease. We do not have any prior cardiac records for him for review. [facility] the winter of 2019 with acute on chronic diastolic CHF requiring IV diuresis. He was most recently admitted to [facility] in 07/2017 with acute on chronic diastolic CHF. Echo at that time showed EF 55-60%, no RWMA, Gr2DD, mild AS with mild AI, mild MR, mildly dilated LA, moderately dilated RV with moderately reduced RVSF, unable to estimate PASP. He diuresed ~ 16 L and had a discharge weight of 302 pounds. Since his discharge he has followed up with the [facility] several times, noted to be volume up without changes/interventions being made.   Over the past week or so he has noted a slow increase in his weight by about a pound or two on a daily basis. However, over the past 3-4 days he has noted about a 12 pounds weight gain. He reports compliance with his medications, though has had dietary indiscretions, including eating a meatball sub from [employer] on 09/20/17. Because of his worsening SOB, weight gain, abdominal distension,  and lower extremity swelling he presented to [facility]. He has never had any angina, only abdominal fullness. No orthopnea or early satiety.   Upon the patient's arrival to [facility] they were found to have BP 140/65, HR 64 bpm, temp 99, oxygen saturation 94% on room air, weight 310 pounds. EKG showed NSR, 1st degree AV block, RBBB, CXR showed stable cardiomegaly with aortic atherosclerosis and interstitial edema. Labs showed BNP 555, troponin negative x 1, not cycled, BUN 41, SCr 1.65 (baseline ~ 1.3-1.5), K+ 4.8, Na 133, glucose 176, WBC 8.0, HGB 10.6, PLT 180, TSH normal. In the ED he was given IV Lasix 40 mg x 1, albuterol, and IV SoluMedrol. Since his admission, his weight has trended from 310-->307 pounds. Documented UOP of 400 mL for the admission, though the patient reports more UOP than this. He continues to note SOB, though this is improving.   Past Medical History:  Diagnosis Date  . Aortic valve disease    a. s/p bioprosthetic AVR in ~ 2014  . CHF (congestive heart failure) (HCC)   . CKD (chronic kidney disease), stage II   . Diabetes mellitus with complication (HCC)   . Hypertension   . Morbid obesity (HCC)   . OSA (obstructive sleep apnea)    a. on CPAP    Past Surgical History:  Procedure Laterality Date  . AORTA SURGERY    . APPENDECTOMY    . CARDIAC SURGERY     heart valve replaced     Home Meds: Prior  to Admission medications   Medication Sig Start Date End Date Taking? Authorizing Provider  acetaminophen (TYLENOL) 500 MG tablet Take 500-1,000 mg by mouth every 6 (six) hours as needed.   Yes [provider]  acidophilus (RISAQUAD) CAPS capsule Take 1 capsule by mouth daily.    Yes [provider]  albuterol (PROVENTIL HFA;VENTOLIN HFA) 108 (90 Base) MCG/ACT inhaler Inhale 2 puffs into the lungs every 6 (six) hours as needed for wheezing or shortness of breath.   Yes [provider]  ammonium lactate (LAC-HYDRIN) 12 % lotion Apply 1 application  topically as needed for dry skin.   Yes [provider]  aspirin EC 81 MG tablet Take 81 mg by mouth daily.   Yes [provider]  atorvastatin (LIPITOR) 40 MG tablet Take 40 mg by mouth at bedtime.   Yes [provider]  carvedilol (COREG) 12.5 MG tablet Take 12.5 mg by mouth 2 (two) times daily with a meal.   Yes [provider]  furosemide (LASIX) 40 MG tablet Take 1 tablet (40 mg total) by mouth 2 (two) times daily. Patient taking differently: Take 40 mg by mouth 2 (two) times daily. Takes in the morning and at noon 07/18/17  Yes Enedina Finner, MD  glucagon (GLUCAGON EMERGENCY) 1 MG injection Inject 1 mg into the vein once as needed (emergency hypoglycemia).   Yes [provider]  insulin glargine (LANTUS) 100 UNIT/ML injection Inject 40 Units into the skin 2 (two) times daily.    Yes [provider]  insulin regular (NOVOLIN R,HUMULIN R) 100 units/mL injection Inject 20 Units into the skin 3 (three) times daily before meals. If blood sugar is over 125.   Yes [provider]  lisinopril (PRINIVIL,ZESTRIL) 30 MG tablet Take 1 tablet by mouth daily.   Yes [provider]  omeprazole (PRILOSEC) 20 MG capsule Take 20 mg by mouth daily.   Yes [provider]  potassium chloride SA (K-DUR,KLOR-CON) 20 MEQ tablet Take 1 tablet (20 mEq total) by mouth daily. 07/19/17  Yes Enedina Finner, MD  sertraline (ZOLOFT) 50 MG tablet Take 1 tablet by mouth daily.    Yes [provider]  tamsulosin (FLOMAX) 0.4 MG CAPS capsule Take 0.4 mg by mouth daily.   Yes [provider]  Tiotropium Bromide-Olodaterol 2.5-2.5 MCG/ACT AERS Inhale 2 puffs into the lungs 2 (two) times daily.    Yes [provider]    Inpatient Medications: Scheduled Meds: . acidophilus  1 capsule Oral Daily  . aspirin EC  81 mg Oral Daily  . atorvastatin  40 mg Oral QHS  . carvedilol  12.5 mg Oral BID WC  . docusate sodium  100 mg Oral BID    . fluticasone furoate-vilanterol  1 puff Inhalation Daily  . furosemide  80 mg Intravenous Q6H  . heparin  5,000 Units Subcutaneous Q8H  . insulin aspart  0-15 Units Subcutaneous TID WC  . insulin glargine  20 Units Subcutaneous Once  . insulin glargine  50 Units Subcutaneous QHS  . lisinopril  30 mg Oral Daily  . pantoprazole  40 mg Oral Daily  . [START ON 09/23/2017] potassium chloride SA  20 mEq Oral Daily  . sertraline  50 mg Oral Daily  . tamsulosin  0.4 mg Oral Daily   Continuous Infusions:  PRN Meds: acetaminophen **OR** acetaminophen, ammonium lactate, ondansetron **OR** ondansetron (ZOFRAN) IV  Allergies:   Allergies  Allergen Reactions  . Latex     Social History:  Social History   Socioeconomic History  . Marital status: Married    Spouse name: Not on file  . Number of children: Not on file  . Years of education: Not on file  . Highest education level: Not on file  Occupational History  . Not on file  Social Needs  . Financial resource strain: Not on file  . Food insecurity:    Worry: Not on file    Inability: Not on file  . Transportation needs:    Medical: Not on file    Non-medical: Not on file  Tobacco Use  . Smoking status: Former Smoker    Types: Cigarettes  . Smokeless tobacco: Never Used  Substance and Sexual Activity  . Alcohol use: Not on file  . Drug use: Not on file  . Sexual activity: Not on file  Lifestyle  . Physical activity:    Days per week: Not on file    Minutes per session: Not on file  . Stress: Not on file  Relationships  . Social connections:    Talks on phone: Not on file    Gets together: Not on file    Attends religious service: Not on file    Active member of club or organization: Not on file    Attends meetings of clubs or organizations: Not on file    Relationship status: Not on file  . Intimate partner violence:    Fear of current or ex partner: Not on file    Emotionally abused: Not on file    Physically  abused: Not on file    Forced sexual activity: Not on file  Other Topics Concern  . Not on file  Social History Narrative  . Not on file     Family History:   Family History  Problem Relation Age of Onset  . Diabetes Mother   . Diabetes Father   . Kidney disease Father   . Diabetes Sister     ROS:  Review of Systems  Constitutional: Positive for malaise/fatigue. Negative for chills, diaphoresis, fever and weight loss.  HENT: Negative for congestion.   Eyes: Negative for discharge and redness.  Respiratory: Positive for shortness of breath. Negative for cough, hemoptysis, sputum production and wheezing.   Cardiovascular: Positive for leg swelling. Negative for chest pain, palpitations, orthopnea, claudication and PND.  Gastrointestinal: Negative for abdominal pain, blood in stool, heartburn, melena, nausea and vomiting.       Abdominal distension   Genitourinary: Negative for hematuria.  Musculoskeletal: Negative for falls and myalgias.  Skin: Negative for rash.  Neurological: Positive for weakness. Negative for dizziness, tingling, tremors, sensory change, speech change, focal weakness and loss of consciousness.  Endo/Heme/Allergies: Does not bruise/bleed easily.  Psychiatric/Behavioral: Negative for substance abuse. The patient is not nervous/anxious.   All other systems reviewed and are negative.     Physical Exam/Data:   Vitals:   09/21/17 0330 09/21/17 0430 09/21/17 0437 09/21/17 0742  BP: (!) 154/78  (!) 155/74 130/60  Pulse: 70 76 76 70  Resp: 17  18 18   Temp:   98 F (36.7 C) 97.9 F (36.6 C)  TempSrc:   Oral Oral  SpO2: 94% 90% 93% 95%  Weight:   (!) 307 lb 3.2 oz (139.3 kg)   Height:   5\' 7"  (1.702 m)     Intake/Output Summary (Last 24 hours) at 09/21/2017 0745 Last data filed at 09/21/2017 0500 Gross per 24 hour  Intake -  Output 400 ml  Net -400 ml   Filed Weights   09/20/17 2044 09/21/17 0437  Weight: (!) 310 lb (140.6 kg) (!) 307 lb 3.2 oz (139.3  kg)   Body mass index is 48.11 kg/m.   Physical Exam: General: Well developed, well nourished, in no acute distress. Head: Normocephalic, atraumatic, sclera non-icteric, no xanthomas, nares without discharge.  Neck: Negative for carotid bruits. JVD difficult to assess 2/2 body habitus. Lungs: Diminished breath sounds bilaterally. Breathing is unlabored. Heart: RRR with S1 S2. II/VI systolic murmur RUSB, no rubs, or gallops appreciated. Abdomen: Soft, non-tender, distended with normoactive bowel sounds. No hepatomegaly. No rebound/guarding. No obvious abdominal masses. Msk:  Strength and tone appear normal for age. Extremities: No clubbing or cyanosis. 2+ pitting edema to the mid shin with chronic changes noted. Distal pedal pulses are 2+ and equal bilaterally. Neuro: Alert and oriented X 3. No facial asymmetry. No focal deficit. Moves all extremities spontaneously. Psych:  Responds to questions appropriately with a normal affect.   EKG:  The EKG was personally reviewed and demonstrates: NSR, 71 bpm, 1st degree AV block, RBBB, PACs Telemetry:  Telemetry was personally reviewed and demonstrates: NSR with 1st degree AV block with PACs  Weights: [facility] Weights   09/20/17 2044 09/21/17 0437  Weight: (!) 310 lb (140.6 kg) (!) 307 lb 3.2 oz (139.3 kg)    Relevant CV Studies: 07/15/2017: Study Conclusions  - Left ventricle: The cavity size was normal. There was moderate   concentric hypertrophy. Systolic function was normal. The   estimated ejection fraction was in the range of 55% to 60%. Wall   motion was normal; there were no regional wall motion   abnormalities. Features are consistent with a pseudonormal left   ventricular filling pattern, with concomitant abnormal relaxation   and increased filling pressure (grade 2 diastolic dysfunction). - Aortic valve: There was very mild stenosis. There was mild   regurgitation. - Mitral valve: Calcified annulus. Mildly thickened leaflets .    There was mild regurgitation. - Left atrium: The atrium was mildly dilated. - Right ventricle: The cavity size was moderately dilated. Wall   thickness was normal. Systolic function was moderately reduced. - Pulmonary arteries: Systolic pressure could not be accurately   estimated.  Laboratory Data:  Chemistry Recent Labs  Lab 09/20/17 2051  NA 133*  K 4.8  CL 104  CO2 19*  GLUCOSE 176*  BUN 41*  CREATININE 1.65*  CALCIUM 8.6*  GFRNONAA 40*  GFRAA 46*  ANIONGAP 10    No results for input(s): PROT, ALBUMIN, AST, ALT, ALKPHOS, BILITOT in the last 168 hours. Hematology Recent Labs  Lab 09/20/17 2051  WBC 8.0  RBC 3.41*  HGB 10.6*  HCT 31.2*  MCV 91.4  MCH 31.2  MCHC 34.2  RDW 18.3*  PLT 180   Cardiac Enzymes Recent Labs  Lab 09/20/17 2051  TROPONINI <0.03   No results for input(s): TROPIPOC in the last 168 hours.  BNP Recent Labs  Lab 09/20/17 2051  BNP 555.0*    DDimer No results for input(s): DDIMER in the last 168 hours.  Radiology/Studies:  Dg Chest 2 View  Result Date: 09/20/2017 IMPRESSION: Stable cardiomegaly with aortic atherosclerosis. Interval increase in interstitial edema consistent with CHF. Electronically Signed   By: Tollie Eth M.D.   On: 09/20/2017 21:17    Assessment and Plan:   1. Acute on chronic diastolic CHF: -He continues to appear volume up -Weight up ~ 12 pounds over the past 3-4 days -Has been  compliant with medications, though not with diet likely exacerbating his CHF -Continue IV Lasix, though will decrease from 80 mg q 6 hours to tid with KCl repletion -Coreg  -Recent echo as above, no need to repeat at this time -CHF education -Daily weights, strict Is and Os -Needs to follow up with cardiology as an outpatient ([place] or [facility]) -Cycle troponin to rule out -Would hold on any further IV steroids as this is not a COPD exacerbation and will lead to fluid retention   2. Bioprosthetic AVR: -Followed by [facility]  cardiology -Recent echo as above  3. Acute on CKD stage III: -Close monitoring of renal function with diuresis   4. Morbid obesity/OSA/possible OHS: -Weight loss advised -CPAP -Recommend dietitian as an outpatient  5. IDDM: -Per IM  6. Anemia of chronic disease: -Per IM   For questions or updates, please contact [facility] Please consult [email or web address] for contact info under Cardiology/STEMI.   Signed, Eula Listen, PA-C [facility] Pager: [phone] 09/21/2017, 7:45 AM

## 2017-09-21 NOTE — Progress Notes (Signed)
[employer] [facility] [place] [facility]   PATIENT NAME: Taylor Stephenson    MR#:  [id number]  DATE OF BIRTH:  September 30, 1945  SUBJECTIVE:   Patient presents to the hospital due to worsening shortness of breath and lower extremity edema noted to be in CHF.  Patient has had a 12 pound weight gain over the past few days.  Still complaining of some shortness of breath, worsening LE edema.   REVIEW OF SYSTEMS:    Review of Systems  Constitutional: Negative for chills and fever.  HENT: Negative for congestion and tinnitus.   Eyes: Negative for blurred vision and double vision.  Respiratory: Positive for shortness of breath. Negative for cough and wheezing.   Cardiovascular: Positive for leg swelling. Negative for chest pain, orthopnea and PND.  Gastrointestinal: Negative for abdominal pain, diarrhea, nausea and vomiting.  Genitourinary: Negative for dysuria and hematuria.  Neurological: Negative for dizziness, sensory change and focal weakness.  All other systems reviewed and are negative.   Nutrition: Heart Healthy/Carb control Tolerating Diet: Yes Tolerating PT: Await Eval.   DRUG ALLERGIES:   Allergies  Allergen Reactions  . Latex     VITALS:  Blood pressure 130/60, pulse 70, temperature 97.9 F (36.6 C), temperature source Oral, resp. rate 18, height 5\' 7"  (1.702 m), weight (!) 139.3 kg (307 lb 3.2 oz), SpO2 95 %.  PHYSICAL EXAMINATION:   Physical Exam  GENERAL:  72 y.o.-year-old obese patient lying in bed in no acute distress.  EYES: Pupils equal, round, reactive to light and accommodation. No scleral icterus. Extraocular muscles intact.  HEENT: Head atraumatic, normocephalic. Oropharynx and nasopharynx clear.  NECK:  Supple, no jugular venous distention. No thyroid enlargement, no tenderness.  LUNGS: Normal breath sounds bilaterally, no wheezing, basilar rales, rhonchi. No use of accessory muscles of respiration.  CARDIOVASCULAR: S1, S2 normal. No murmurs,  rubs, or gallops.  ABDOMEN: Soft, nontender, nondistended. Bowel sounds present. No organomegaly or mass.  EXTREMITIES: No cyanosis, clubbing, + 2 edema b/l.  Signs of chronic venous stasis bilaterally    NEUROLOGIC: Cranial nerves II through XII are intact. No focal Motor or sensory deficits b/l.  Globally weak PSYCHIATRIC: The patient is alert and oriented x 3.  SKIN: No obvious rash, lesion, or ulcer.  Signs of chronic venous stasis bilaterally   LABORATORY PANEL:   CBC Recent Labs  Lab 09/20/17 2051  WBC 8.0  HGB 10.6*  HCT 31.2*  PLT 180   ------------------------------------------------------------------------------------------------------------------  Chemistries  Recent Labs  Lab 09/20/17 2051  NA 133*  K 4.8  CL 104  CO2 19*  GLUCOSE 176*  BUN 41*  CREATININE 1.65*  CALCIUM 8.6*   ------------------------------------------------------------------------------------------------------------------  Cardiac Enzymes Recent Labs  Lab 09/20/17 2051  TROPONINI <0.03   ------------------------------------------------------------------------------------------------------------------  RADIOLOGY:  Dg Chest 2 View  Result Date: 09/20/2017 CLINICAL DATA:  Bilateral leg swelling x1 week. EXAM: CHEST - 2 VIEW COMPARISON:  07/14/2017 FINDINGS: Stable cardiomegaly with moderate aortic atherosclerosis. Median sternotomy sutures are again noted with aortic valvular replacement. Interval increase in interstitial edema and pulmonary vascular congestion consistent with CHF. No effusion or pulmonary consolidation. No pneumothorax. Degenerative changes are present along the dorsal spine. IMPRESSION: Stable cardiomegaly with aortic atherosclerosis. Interval increase in interstitial edema consistent with CHF. Electronically Signed   By: Tollie Eth M.D.   On: 09/20/2017 21:17     ASSESSMENT AND PLAN:   72 year old male with past medical history of diabetes, hypertension, morbid  obesity, obstructive sleep apnea, chronic diastolic  CHF who presents to the hospital due to worsening shortness of breath and lower extremity edema.  1.  CHF-acute on chronic diastolic dysfunction. - Patient has had up to 12 pound weight gain over the past week or so. - We will continue diuresis with IV Lasix, follow I's and O's and daily weights. -Continue carvedilol, lisinopril.  2.  CKD stage III-patient's creatinine is close to baseline.  Will follow with IV diuresis.  3.  Diabetes type 2 without complication-continue Lantus, sliding scale insulin.  4.  Obstructive sleep apnea-continue patient's CPAP at bedtime.  5. Essential HTN - cont. Coreg, Lisinopril.   6. BPH - cont. Flomax.   7. Depression - cont. Zoloft.     All the records are reviewed and case discussed with Care Management/Social Worker. Management plans discussed with the patient, family and they are in agreement.  CODE STATUS: Full code   DVT Prophylaxis: Hep SQ  TOTAL TIME TAKING CARE OF THIS PATIENT: 30 minutes.   POSSIBLE D/C IN 2-3 DAYS, DEPENDING ON CLINICAL CONDITION.   Houston SirenSAINANI,Floris Neuhaus J M.D on 09/21/2017 at 2:17 PM  Between 7am to 6pm - Pager - [phone]  After 6pm go to [email or web address] - Therapist, nutritionalpassword EPAS [facility]  [facility]  Office  [phone]  CC: Primary care physician; [facility]

## 2017-09-21 NOTE — ED Notes (Signed)
Pt placed back in stretcher, placed c-pap on sats increased to 98% , resting with no signs of increased work of breathing

## 2017-09-21 NOTE — Care Management Note (Signed)
Case Management Note  Patient Details  Name: Taylor GaribaldiJackie Stephenson MRN: [id number] Date of Birth: 1945/11/08  Subjective/Objective:    [facility] with the diagnosis of acute on chronic diastolic congestive heartfailure. Lives with wife, Lysle Dingwallamala [phone]), Last seen Dr. Jerre SimonLaBardo [facility] [place] 2-3 months ago. Gets medications from veteran's facility. No home health. No skilled facility. No home oxygen. Takes care of all basic and instrumental activities of daily living himself, drives. Eased to the floor during a hypoglycemic episode. Cane (4-prong) rolling walker, and CPAP in the home.                 Action/Plan: Does not want to [facility] to [facility]. Will fax signed and witnessed form with updated information to [facility].   Expected Discharge Date:                  Expected Discharge Plan:     In-House Referral:   yes  Discharge planning Services   yes  Post Acute Care Choice:    Choice offered to:     DME Arranged:    DME Agency:     HH Arranged:    HH Agency:     Status of Service:     If discussed at [employer] Length of Stay Meetings, dates discussed:    Additional Comments:  Gwenette GreetBrenda S Giulia Hickey, RN MSN [facility] Care Management [phone] 09/21/2017, 1:36 PM

## 2017-09-21 NOTE — ED Provider Notes (Signed)
[facility] Emergency Department Provider Note  ___________________________________________   First MD Initiated Contact with Patient 09/20/17 2209     (approximate)  I have reviewed the triage vital signs and the nursing notes.   HISTORY  Chief Complaint Leg Swelling   HPI Taylor Stephenson is a 72 y.o. male who presents to the emergency department for treatment and evaluation of shortness of breath.  States that his lower legs have been swelling more than usual for the past week.  He takes 40 mg of Lasix twice a day.  No chest pain, but he complains of shortness of breath with any exertion.  He is sleeping in his recliner and is not able to lie back in bed even with several pillows.  He states that he has gained about 6 pounds over the past 3 days.   Past Medical History:  Diagnosis Date  . Aortic valve disease    a. s/p bioprosthetic AVR in ~ 2014  . CHF (congestive heart failure) (HCC)   . CKD (chronic kidney disease), stage II   . Diabetes mellitus with complication (HCC)   . Hypertension   . Morbid obesity (HCC)   . OSA (obstructive sleep apnea)    a. on CPAP    Patient Active Problem List   Diagnosis Date Noted  . Acute on chronic diastolic CHF (congestive heart failure) (HCC) 09/21/2017  . Lymphedema 09/09/2017  . Chronic diastolic heart failure (HCC) 07/30/2017  . HTN (hypertension) 07/30/2017  . Diabetes (HCC) 07/30/2017  . Acute CHF (congestive heart failure) (HCC) 07/14/2017    Past Surgical History:  Procedure Laterality Date  . AORTA SURGERY    . APPENDECTOMY    . CARDIAC SURGERY     heart valve replaced    Prior to Admission medications   Medication Sig Start Date End Date Taking? Authorizing Provider  acetaminophen (TYLENOL) 500 MG tablet Take 500-1,000 mg by mouth every 6 (six) hours as needed.   Yes [provider]  acidophilus (RISAQUAD) CAPS capsule Take 1 capsule by mouth daily.    Yes [provider]    albuterol (PROVENTIL HFA;VENTOLIN HFA) 108 (90 Base) MCG/ACT inhaler Inhale 2 puffs into the lungs every 6 (six) hours as needed for wheezing or shortness of breath.   Yes [provider]  ammonium lactate (LAC-HYDRIN) 12 % lotion Apply 1 application topically as needed for dry skin.   Yes [provider]  aspirin EC 81 MG tablet Take 81 mg by mouth daily.   Yes [provider]  atorvastatin (LIPITOR) 40 MG tablet Take 40 mg by mouth at bedtime.   Yes [provider]  carvedilol (COREG) 12.5 MG tablet Take 12.5 mg by mouth 2 (two) times daily with a meal.   Yes [provider]  furosemide (LASIX) 40 MG tablet Take 1 tablet (40 mg total) by mouth 2 (two) times daily. Patient taking differently: Take 40 mg by mouth 2 (two) times daily. Takes in the morning and at noon 07/18/17  Yes Enedina Finner, MD  glucagon (GLUCAGON EMERGENCY) 1 MG injection Inject 1 mg into the vein once as needed (emergency hypoglycemia).   Yes [provider]  insulin glargine (LANTUS) 100 UNIT/ML injection Inject 40 Units into the skin 2 (two) times daily.    Yes [provider]  insulin regular (NOVOLIN R,HUMULIN R) 100 units/mL injection Inject 20 Units into the skin 3 (three) times daily before meals. If blood sugar is over 125.  Yes [provider]  lisinopril (PRINIVIL,ZESTRIL) 30 MG tablet Take 1 tablet by mouth daily.   Yes [provider]  omeprazole (PRILOSEC) 20 MG capsule Take 20 mg by mouth daily.   Yes [provider]  potassium chloride SA (K-DUR,KLOR-CON) 20 MEQ tablet Take 1 tablet (20 mEq total) by mouth daily. 07/19/17  Yes Enedina FinnerPatel, Sona, MD  sertraline (ZOLOFT) 50 MG tablet Take 1 tablet by mouth daily.    Yes [provider]  tamsulosin (FLOMAX) 0.4 MG CAPS capsule Take 0.4 mg by mouth daily.   Yes [provider]  Tiotropium Bromide-Olodaterol 2.5-2.5 MCG/ACT AERS Inhale 2 puffs into the lungs 2 (two)  times daily.    Yes [provider]    Allergies Latex  Family History  Problem Relation Age of Onset  . Diabetes Mother   . Diabetes Father   . Kidney disease Father   . Diabetes Sister     Social History Social History   Tobacco Use  . Smoking status: Former Smoker    Types: Cigarettes  . Smokeless tobacco: Never Used  Substance Use Topics  . Alcohol use: Not on file  . Drug use: Not on file    Review of Systems  Constitutional: No fever/chills Eyes: No visual changes. ENT: No sore throat. Cardiovascular: Denies chest pain. Respiratory: Positive for shortness of breath. Gastrointestinal: No abdominal pain.  No nausea, no vomiting.  No diarrhea.  No constipation. Genitourinary: Negative for dysuria. Musculoskeletal: Negative for back pain. Skin: Negative for rash. Neurological: Negative for headaches, focal weakness or numbness. ____________________________________________   PHYSICAL EXAM:  VITAL SIGNS: ED Triage Vitals  Enc Vitals Group     BP 09/20/17 2043 140/65     Pulse Rate 09/20/17 2043 64     Resp 09/20/17 2043 20     Temp 09/20/17 2043 99 F (37.2 C)     Temp Source 09/20/17 2043 Oral     SpO2 09/20/17 2043 94 %     Weight 09/20/17 2044 (!) 310 lb (140.6 kg)     Height 09/20/17 2044 5\' 7"  (1.702 m)     Head Circumference --      Peak Flow --      Pain Score 09/20/17 2043 9     Pain Loc --      Pain Edu? --      Excl. in GC? --     Constitutional: Alert and oriented.  Mild respiratory distress Eyes: Conjunctivae are normal. PERRL. Head: Atraumatic. Nose: No congestion/rhinnorhea. Mouth/Throat: Mucous membranes are moist.  Oropharynx non-erythematous. Neck: No stridor.   Cardiovascular: Normal rate, regular rhythm. Good peripheral circulation.  3+ pitting edema bilaterally extending from above the knee to the feet Respiratory: Normal respiratory effort.  Diminished throughout. Gastrointestinal: Soft and nontender. No distention.  No abdominal bruits. No CVA tenderness. Musculoskeletal: No lower extremity tenderness nor edema.  No joint effusions. Neurologic:  Normal speech and language. No gross focal neurologic deficits are appreciated. No gait instability. Skin:  Skin is warm, dry and intact. No rash noted. Psychiatric: Mood and affect are normal. Speech and behavior are normal.  ____________________________________________   LABS (all labs ordered are listed, but only abnormal results are displayed)  Labs Reviewed  BASIC METABOLIC PANEL - Abnormal; Notable for the following components:      Result Value   Sodium 133 (*)    CO2 19 (*)    Glucose, Bld 176 (*)    BUN 41 (*)  Creatinine, Ser 1.65 (*)    Calcium 8.6 (*)    GFR calc non Af Amer 40 (*)    GFR calc Af Amer 46 (*)    All other components within normal limits  CBC - Abnormal; Notable for the following components:   RBC 3.41 (*)    Hemoglobin 10.6 (*)    HCT 31.2 (*)    RDW 18.3 (*)    All other components within normal limits  BRAIN NATRIURETIC PEPTIDE - Abnormal; Notable for the following components:   B Natriuretic Peptide 555.0 (*)    All other components within normal limits  GLUCOSE, CAPILLARY - Abnormal; Notable for the following components:   Glucose-Capillary 145 (*)    All other components within normal limits  GLUCOSE, CAPILLARY - Abnormal; Notable for the following components:   Glucose-Capillary 268 (*)    All other components within normal limits  GLUCOSE, CAPILLARY - Abnormal; Notable for the following components:   Glucose-Capillary 313 (*)    All other components within normal limits  GLUCOSE, CAPILLARY - Abnormal; Notable for the following components:   Glucose-Capillary 328 (*)    All other components within normal limits  GLUCOSE, CAPILLARY - Abnormal; Notable for the following components:   Glucose-Capillary 333 (*)    All other components within normal limits  TROPONIN I  TSH  BASIC METABOLIC PANEL    ____________________________________________  EKG   ____________________________________________  RADIOLOGY  ED MD interpretation:  Cardiomegaly with increase in interstitial edema.  Official radiology report(s): No results found.  ____________________________________________   PROCEDURES  Procedure(s) performed: None  Procedures  Critical Care performed: No  ____________________________________________   INITIAL IMPRESSION / ASSESSMENT AND PLAN / ED COURSE  As part of my medical decision making, I reviewed the following data within the electronic MEDICAL RECORD NUMBER Notes from prior ED visits   72 year old male presenting to the emergency department for evaluation and treatment of shortness of breath and lower extremity edema that has not improved with his prescribed dose of lasix. He states that he is unable to lie in bed due to orthopnea and is having to sleep in the recliner. Shortness of breath worsens with any exertion. Ambulatory oxygen saturation on room air 89% with increased work of breathing after only a few steps. Chest x-ray showing increase in CHF. Creatinine is elevated, therefore only 40mg  of Lasix given IV. He will be admitted for further evaluation and treatment. Case discussed with Dr. Sheryle Hail who will accept the patient to his service.      ____________________________________________   FINAL CLINICAL IMPRESSION(S) / ED DIAGNOSES  Final diagnoses:  Acute on chronic congestive heart failure, unspecified heart failure type [facility])     ED Discharge Orders    None       Note:  This document was prepared using Dragon voice recognition software and may include unintentional dictation errors.    Chinita Pester, FNP 09/21/17 2137    Rebecka Apley, MD 09/28/17 [phone]

## 2017-09-21 NOTE — H&P (Signed)
Taylor Stephenson is an 72 y.o. male.   Chief Complaint: Leg swelling HPI: The patient with past medical history of CHF, CKD, diabetes, hypertension and obstructive sleep apnea presents to the emergency department with shortness of breath.  The patient states that he has had swelling of his lower extremities and increased in body weight over the last week.  His weight would fluctuate 2 to 3 pounds earlier in the week but increased 4 pounds one day and 3 more pounds the next.  That is when he began to feel short of breath.  Tonight he became more dyspneic than he could tolerate which prompted him to seek evaluation in the emergency department where chest x-ray revealed interstitial edema.  The patient received Lasix IV in the emergency department prior to the staff calling the hospitalist service for admission.  Past Medical History:  Diagnosis Date  . Aortic valve disease    a. s/p bioprosthetic AVR in ~ 2014  . CHF (congestive heart failure) ([place])   . CKD (chronic kidney disease), stage II   . Diabetes mellitus with complication ([place])   . Hypertension   . Morbid obesity ([place])   . OSA (obstructive sleep apnea)    a. on CPAP    Past Surgical History:  Procedure Laterality Date  . AORTA SURGERY    . APPENDECTOMY    . CARDIAC SURGERY     heart valve replaced    Family History  Problem Relation Age of Onset  . Diabetes Mother   . Diabetes Father   . Kidney disease Father   . Diabetes Sister    Social History:  reports that he has quit smoking. His smoking use included cigarettes. He has never used smokeless tobacco. His alcohol and drug histories are not on file.  Allergies:  Allergies  Allergen Reactions  . Latex     Medications Prior to Admission  Medication Sig Dispense Refill  . acetaminophen (TYLENOL) 500 MG tablet Take 500-1,000 mg by mouth every 6 (six) hours as needed.    Marland Kitchen acidophilus (RISAQUAD) CAPS capsule Take 1 capsule by mouth daily.     Marland Kitchen albuterol (PROVENTIL  HFA;VENTOLIN HFA) 108 (90 Base) MCG/ACT inhaler Inhale 2 puffs into the lungs every 6 (six) hours as needed for wheezing or shortness of breath.    Marland Kitchen ammonium lactate (LAC-HYDRIN) 12 % lotion Apply 1 application topically as needed for dry skin.    Marland Kitchen aspirin EC 81 MG tablet Take 81 mg by mouth daily.    Marland Kitchen atorvastatin (LIPITOR) 40 MG tablet Take 40 mg by mouth at bedtime.    . carvedilol (COREG) 12.5 MG tablet Take 12.5 mg by mouth 2 (two) times daily with a meal.    . furosemide (LASIX) 40 MG tablet Take 1 tablet (40 mg total) by mouth 2 (two) times daily. (Patient taking differently: Take 40 mg by mouth 2 (two) times daily. Takes in the morning and at noon) 30 tablet 1  . glucagon (GLUCAGON EMERGENCY) 1 MG injection Inject 1 mg into the vein once as needed (emergency hypoglycemia).    . insulin glargine (LANTUS) 100 UNIT/ML injection Inject 40 Units into the skin 2 (two) times daily.     . insulin regular (NOVOLIN R,HUMULIN R) 100 units/mL injection Inject 20 Units into the skin 3 (three) times daily before meals. If blood sugar is over 125.    Marland Kitchen lisinopril (PRINIVIL,ZESTRIL) 30 MG tablet Take 1 tablet by mouth daily.    Marland Kitchen omeprazole (PRILOSEC) 20  MG capsule Take 20 mg by mouth daily.    . potassium chloride SA (K-DUR,KLOR-CON) 20 MEQ tablet Take 1 tablet (20 mEq total) by mouth daily. 30 tablet 0  . sertraline (ZOLOFT) 50 MG tablet Take 1 tablet by mouth daily.     . tamsulosin (FLOMAX) 0.4 MG CAPS capsule Take 0.4 mg by mouth daily.    . Tiotropium Bromide-Olodaterol 2.5-2.5 MCG/ACT AERS Inhale 2 puffs into the lungs 2 (two) times daily.       Results for orders placed or performed during the hospital encounter of 09/20/17 (from the past 48 hour(s))  Basic metabolic panel     Status: Abnormal   Collection Time: 09/20/17  8:51 PM  Result Value Ref Range   Sodium 133 (L) 135 - 145 mmol/L   Potassium 4.8 3.5 - 5.1 mmol/L   Chloride 104 98 - 111 mmol/L    Comment: Please note change in  reference range.   CO2 19 (L) 22 - 32 mmol/L   Glucose, Bld 176 (H) 70 - 99 mg/dL    Comment: Please note change in reference range.   BUN 41 (H) 8 - 23 mg/dL    Comment: Please note change in reference range.   Creatinine, Ser 1.65 (H) 0.61 - 1.24 mg/dL   Calcium 8.6 (L) 8.9 - 10.3 mg/dL   GFR calc non Af Amer 40 (L) >60 mL/min   GFR calc Af Amer 46 (L) >60 mL/min    Comment: (NOTE) The eGFR has been calculated using the CKD EPI equation. This calculation has not been validated in all clinical situations. eGFR's persistently <60 mL/min signify possible Chronic Kidney Disease.    Anion gap 10 5 - 15    Comment: Performed at [facility], [address] [place], [place] [address]  CBC     Status: Abnormal   Collection Time: 09/20/17  8:51 PM  Result Value Ref Range   WBC 8.0 3.8 - 10.6 K/uL   RBC 3.41 (L) 4.40 - 5.90 MIL/uL   Hemoglobin 10.6 (L) 13.0 - 18.0 g/dL   HCT 31.2 (L) 40.0 - 52.0 %   MCV 91.4 80.0 - 100.0 fL   MCH 31.2 26.0 - 34.0 pg   MCHC 34.2 32.0 - 36.0 g/dL   RDW 18.3 (H) 11.5 - 14.5 %   Platelets 180 150 - 440 K/uL    Comment: Performed at [facility], [address] [place], [place] [address]  Troponin I     Status: None   Collection Time: 09/20/17  8:51 PM  Result Value Ref Range   Troponin I <0.03 <0.03 ng/mL    Comment: Performed at [facility], [address] [place], [place] [address]  Brain natriuretic peptide     Status: Abnormal   Collection Time: 09/20/17  8:51 PM  Result Value Ref Range   B Natriuretic Peptide 555.0 (H) 0.0 - 100.0 pg/mL    Comment: Performed at [facility], [address] [place], [place] [address]  TSH     Status: None   Collection Time: 09/21/17  1:33 AM  Result Value Ref Range   TSH 3.031 0.350 - 4.500 uIU/mL    Comment: Performed by a 3rd Generation assay with a functional sensitivity of <=0.01 uIU/mL. Performed at [facility], [address] [place], [place]  [address]   Glucose, capillary     Status: Abnormal   Collection Time: 09/21/17  5:06 AM  Result Value Ref Range  Glucose-Capillary 145 (H) 70 - 99 mg/dL   Comment 1 Notify RN    Comment 2 Document in Chart    Dg Chest 2 View  Result Date: 09/20/2017 CLINICAL DATA:  Bilateral leg swelling x1 week. EXAM: CHEST - 2 VIEW COMPARISON:  07/14/2017 FINDINGS: Stable cardiomegaly with moderate aortic atherosclerosis. Median sternotomy sutures are again noted with aortic valvular replacement. Interval increase in interstitial edema and pulmonary vascular congestion consistent with CHF. No effusion or pulmonary consolidation. No pneumothorax. Degenerative changes are present along the dorsal spine. IMPRESSION: Stable cardiomegaly with aortic atherosclerosis. Interval increase in interstitial edema consistent with CHF. Electronically Signed   By: Ashley Royalty M.D.   On: 09/20/2017 21:17    Review of Systems  Constitutional: Negative for chills and fever.  HENT: Negative for sore throat and tinnitus.   Eyes: Negative for blurred vision and redness.  Respiratory: Positive for shortness of breath. Negative for cough.   Cardiovascular: Positive for leg swelling. Negative for chest pain, palpitations, orthopnea and PND.  Gastrointestinal: Negative for abdominal pain, diarrhea, nausea and vomiting.  Genitourinary: Negative for dysuria, frequency and urgency.  Musculoskeletal: Negative for joint pain and myalgias.  Skin: Negative for rash.       No lesions  Neurological: Negative for speech change, focal weakness and weakness.  Endo/Heme/Allergies: Does not bruise/bleed easily.       No temperature intolerance  Psychiatric/Behavioral: Negative for depression and suicidal ideas.    Blood pressure (!) 155/74, pulse 76, temperature 98 F (36.7 C), temperature source Oral, resp. rate 18, height 5' 7"  (1.702 m), weight (!) 139.3 kg (307 lb 3.2 oz), SpO2 93 %. Physical Exam  Vitals reviewed. Constitutional: He  is oriented to person, place, and time. He appears well-developed and well-nourished. No distress.  HENT:  Head: Normocephalic and atraumatic.  Mouth/Throat: Oropharynx is clear and moist.  Eyes: Pupils are equal, round, and reactive to light. Conjunctivae and EOM are normal. No scleral icterus.  Neck: Normal range of motion. Neck supple. No JVD present. No tracheal deviation present. No thyromegaly present.  Cardiovascular: Normal rate, regular rhythm and normal heart sounds. Exam reveals no gallop and no friction rub.  No murmur heard. Respiratory: Effort normal and breath sounds normal. No respiratory distress.  GI: Soft. Bowel sounds are normal. He exhibits no distension. There is no tenderness.  Genitourinary:  Genitourinary Comments: Deferred  Musculoskeletal: Normal range of motion. He exhibits no edema.  Lymphadenopathy:    He has no cervical adenopathy.  Neurological: He is alert and oriented to person, place, and time. No cranial nerve deficit.  Skin: Skin is warm and dry. No rash noted. No erythema.  Psychiatric: He has a normal mood and affect. His behavior is normal. Judgment and thought content normal.     Assessment/Plan This is a 72 year old male admitted for CHF exacerbation. 1.  CHF: Combined; acute on chronic with associated hypoxia.  Schedule Lasix IV every 6 hours and supplemental oxygen as needed. 2.  Obstructive sleep apnea: Continue CPAP.  May use during the day if needed for respiratory distress.  Perhaps the patient has some component of COPD as well as he is on a long-acting bronchial agonist.  Continue per home regimen 3.  Hypertension: Controlled; continue carvedilol and lisinopril 4.  Diabetes mellitus type II: Continue basal insulin therapy.  Sliding scale insulin while hospitalized.  Hold oral hypoglycemic agents. 5.  Hyperlipidemia: Continue statin therapy 6.  BPH: Continue tamsulosin 7.  Depression: Continue Zoloft 8.  DVT  prophylaxis: Heparin 9.  GI  prophylaxis: Pantoprazole per home regimen Exline the patient is a full code.  Time spent on admission orders and patient care proximally 45 minutes  Harrie Foreman, MD 09/21/2017, 7:41 AM

## 2017-09-21 NOTE — ED Notes (Signed)
Pt sleeping easily awaken with verbal stimuli, O2 sat 90% normally on c-pap at night at home.  O2 applied [place] 2 liters

## 2017-09-22 DIAGNOSIS — E1165 Type 2 diabetes mellitus with hyperglycemia: Secondary | ICD-10-CM

## 2017-09-22 DIAGNOSIS — D631 Anemia in chronic kidney disease: Secondary | ICD-10-CM

## 2017-09-22 DIAGNOSIS — I509 Heart failure, unspecified: Secondary | ICD-10-CM

## 2017-09-22 DIAGNOSIS — N183 Chronic kidney disease, stage 3 (moderate): Secondary | ICD-10-CM

## 2017-09-22 LAB — GLUCOSE, CAPILLARY
Glucose-Capillary: 262 mg/dL — ABNORMAL HIGH (ref 70–99)
Glucose-Capillary: 298 mg/dL — ABNORMAL HIGH (ref 70–99)
Glucose-Capillary: 57 mg/dL — ABNORMAL LOW (ref 70–99)
Glucose-Capillary: 74 mg/dL (ref 70–99)
Glucose-Capillary: 109 mg/dL — ABNORMAL HIGH (ref 70–99)
Glucose-Capillary: 52 mg/dL — ABNORMAL LOW (ref 70–99)
Glucose-Capillary: 134 mg/dL — ABNORMAL HIGH (ref 70–99)

## 2017-09-22 LAB — BASIC METABOLIC PANEL
ANION GAP: 7 (ref 5–15)
BUN: 53 mg/dL — ABNORMAL HIGH (ref 8–23)
CALCIUM: 8.5 mg/dL — AB (ref 8.9–10.3)
CO2: 26 mmol/L (ref 22–32)
Chloride: 101 mmol/L (ref 98–111)
Creatinine, Ser: 1.84 mg/dL — ABNORMAL HIGH (ref 0.61–1.24)
GFR, EST AFRICAN AMERICAN: 40 mL/min — AB (ref >60)
GFR, EST NON AFRICAN AMERICAN: 35 mL/min — AB (ref >60)
Glucose, Bld: 317 mg/dL — ABNORMAL HIGH (ref 70–99)
Potassium: 4.4 mmol/L (ref 3.5–5.1)
SODIUM: 134 mmol/L — AB (ref 135–145)

## 2017-09-22 MED ORDER — LISINOPRIL 5 MG PO TABS
2.5000 mg | ORAL_TABLET | Freq: Every day | ORAL | Status: DC
  Administered 2017-09-23 – 2017-09-24 (×2): 2.5 mg via ORAL
  Filled 2017-09-22 (×3): qty 1

## 2017-09-22 MED ORDER — DEXTROSE 50 % IV SOLN
1.0000 | Freq: Once | INTRAVENOUS | Status: AC
  Administered 2017-09-22: 50 mL via INTRAVENOUS
  Filled 2017-09-22: qty 50

## 2017-09-22 MED ORDER — INSULIN ASPART 100 UNIT/ML ~~LOC~~ SOLN: 6.0000 [IU] | Freq: Three times a day (TID) | SUBCUTANEOUS | Status: DC

## 2017-09-22 MED ORDER — PREMIER PROTEIN SHAKE
11.0000 [oz_av] | Freq: Two times a day (BID) | ORAL | Status: DC
  Administered 2017-09-22 – 2017-09-24 (×4): 11 [oz_av] via ORAL

## 2017-09-22 MED ORDER — LACTULOSE 10 GM/15ML PO SOLN
30.0000 g | Freq: Two times a day (BID) | ORAL | Status: DC | PRN
  Administered 2017-09-22: 30 g via ORAL
  Filled 2017-09-22: qty 60

## 2017-09-22 MED ORDER — INSULIN GLARGINE 100 UNIT/ML ~~LOC~~ SOLN
40.0000 [IU] | Freq: Once | SUBCUTANEOUS | Status: AC
  Administered 2017-09-22: 40 [IU] via SUBCUTANEOUS
  Filled 2017-09-22: qty 0.4

## 2017-09-22 MED ORDER — CARVEDILOL 3.125 MG PO TABS
3.1250 mg | ORAL_TABLET | Freq: Two times a day (BID) | ORAL | Status: DC
  Administered 2017-09-23 – 2017-09-24 (×3): 3.125 mg via ORAL
  Filled 2017-09-22 (×4): qty 1

## 2017-09-22 MED ORDER — INSULIN GLARGINE 100 UNIT/ML ~~LOC~~ SOLN
40.0000 [IU] | Freq: Two times a day (BID) | SUBCUTANEOUS | Status: DC
  Filled 2017-09-22 (×3): qty 0.4

## 2017-09-22 NOTE — Progress Notes (Signed)
CRITICAL VALUE ALERT  Critical Value:  Insulin 52  Date & Time Notied:  09/22/2017   Provider Notified: sainani   Orders Received/Actions taken: hold lantus pm, d/c scheduled meal coverage, give amp d50

## 2017-09-22 NOTE — Plan of Care (Signed)
°  Problem: Clinical Measurements: °Goal: Respiratory complications will improve °Outcome: Progressing °Goal: Cardiovascular complication will be avoided °Outcome: Progressing °  °Problem: Activity: °Goal: Risk for activity intolerance will decrease °Outcome: Progressing °  °

## 2017-09-22 NOTE — Plan of Care (Signed)
Nutrition Education Note  RD consulted for nutrition education regarding CHF.  72 y.o. 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  MET with pt in room today. RD familiar with this pt as she has provided education to him in the past. Pt reports making numerous changes since last admit. Pt went through his cabinets and donated all canned and processed foods. Pt has started looking at labels and had avoided foods high in sodium. Pt no longer uses the salt shaker. Pt has also joined the gym and has started exercising in the pool. RD offered refresher education today and answered any questions pt had.   RD provided "Low Sodium Nutrition Therapy" handout from the Academy of [employer]. Reviewed patient's dietary recall. Provided examples on ways to decrease sodium intake in diet. Discouraged intake of processed foods and use of salt shaker. Encouraged fresh fruits and vegetables as well as whole grain sources of carbohydrates to maximize fiber intake.   RD discussed why it is important for patient to adhere to diet recommendations, and emphasized the role of fluids, foods to avoid, and importance of weighing self daily. Teach back method used.  Expect good compliance.  Body mass index is 47.39 kg/m. Pt meets criteria for morbidly obese based on current BMI.  Current diet order is HH, patient is consuming approximately 100% of meals at this time. Labs and medications reviewed. No further nutrition interventions warranted at this time. RD contact information provided. If additional nutrition issues arise, please re-consult RD.   Koleen Distance MS, RD, LDN Pager #- [phone] Office#- [phone] After  Hours Pager: [phone]

## 2017-09-22 NOTE — Progress Notes (Signed)
[facility] - [place] at [facility]   PATIENT NAME: Taylor Stephenson    MR#:  [id number]  DATE OF BIRTH:  1946-02-28  SUBJECTIVE:   Responding to diuretics well and Renal function going up.  Plan to switch to Oral diuretics tomorrow.  Feels better and more awake today.   REVIEW OF SYSTEMS:    Review of Systems  Constitutional: Negative for chills and fever.  HENT: Negative for congestion and tinnitus.   Eyes: Negative for blurred vision and double vision.  Respiratory: Positive for shortness of breath. Negative for cough and wheezing.   Cardiovascular: Positive for leg swelling. Negative for chest pain, orthopnea and PND.  Gastrointestinal: Negative for abdominal pain, diarrhea, nausea and vomiting.  Genitourinary: Negative for dysuria and hematuria.  Neurological: Negative for dizziness, sensory change and focal weakness.  All other systems reviewed and are negative.   Nutrition: Heart Healthy/Carb control Tolerating Diet: Yes Tolerating PT: Await Eval.   DRUG ALLERGIES:   Allergies  Allergen Reactions  . Latex     VITALS:  Blood pressure (!) 104/51, pulse 63, temperature (!) 97.5 F (36.4 C), temperature source Oral, resp. rate 18, height 5\' 7"  (1.702 m), weight (!) 137.3 kg (302 lb 9.6 oz), SpO2 93 %.  PHYSICAL EXAMINATION:   Physical Exam  GENERAL:  72 y.o.-year-old obese patient lying in bed in no acute distress.  EYES: Pupils equal, round, reactive to light and accommodation. No scleral icterus. Extraocular muscles intact.  HEENT: Head atraumatic, normocephalic. Oropharynx and nasopharynx clear.  NECK:  Supple, no jugular venous distention. No thyroid enlargement, no tenderness.  LUNGS: Normal breath sounds bilaterally, no wheezing, basilar rales, rhonchi. No use of accessory muscles of respiration.  CARDIOVASCULAR: S1, S2 normal. No murmurs, rubs, or gallops.  ABDOMEN: Soft, nontender, nondistended. Bowel sounds present. No organomegaly or mass.   EXTREMITIES: No cyanosis, clubbing, + 1-2 edema b/l.  Signs of chronic venous stasis bilaterally    NEUROLOGIC: Cranial nerves II through XII are intact. No focal Motor or sensory deficits b/l.  Globally weak PSYCHIATRIC: The patient is alert and oriented x 3.  SKIN: No obvious rash, lesion, or ulcer.  Signs of chronic venous stasis bilaterally   LABORATORY PANEL:   CBC Recent Labs  Lab 09/20/17 2051  WBC 8.0  HGB 10.6*  HCT 31.2*  PLT 180   ------------------------------------------------------------------------------------------------------------------  Chemistries  Recent Labs  Lab 09/22/17 0548  NA 134*  K 4.4  CL 101  CO2 26  GLUCOSE 317*  BUN 53*  CREATININE 1.84*  CALCIUM 8.5*   ------------------------------------------------------------------------------------------------------------------  Cardiac Enzymes Recent Labs  Lab 09/20/17 2051  TROPONINI <0.03   ------------------------------------------------------------------------------------------------------------------  RADIOLOGY:  Dg Chest 2 View  Result Date: 09/20/2017 CLINICAL DATA:  Bilateral leg swelling x1 week. EXAM: CHEST - 2 VIEW COMPARISON:  07/14/2017 FINDINGS: Stable cardiomegaly with moderate aortic atherosclerosis. Median sternotomy sutures are again noted with aortic valvular replacement. Interval increase in interstitial edema and pulmonary vascular congestion consistent with CHF. No effusion or pulmonary consolidation. No pneumothorax. Degenerative changes are present along the dorsal spine. IMPRESSION: Stable cardiomegaly with aortic atherosclerosis. Interval increase in interstitial edema consistent with CHF. Electronically Signed   By: Tollie Eth M.D.   On: 09/20/2017 21:17     ASSESSMENT AND PLAN:   72 year old male with past medical history of diabetes, hypertension, morbid obesity, obstructive sleep apnea, chronic diastolic CHF who presents to the hospital due to worsening  shortness of breath and lower extremity edema.  1.  CHF-acute on chronic diastolic dysfunction. - Patient has had up to 12 pound weight gain prior to admission.  -Old with IV diuresis, not creatinine trending up and therefore evening Lasix dose being held.  Plan to switch to oral diuretics tomorrow as per cardiology.  he is about 4 L negative since admission. -Continue carvedilol, lisinopril.  2.  CKD stage III-  Cr. started to trend somewhat upwards with IV diuresis.  Evening dose of diuretics held, and will switch to oral diuretics [place].  Follow BUN and creatinine.  3.  Diabetes type 2 without complication- his blood sugars are somewhat uncontrolled.  We will advance Lantus dose, add NovoLog with meals, continue sliding scale insulin.  Follow blood sugars.  Appreciate diabetes coordinator input.  4.  Obstructive sleep apnea-continue patient's CPAP at bedtime.  5. Essential HTN - cont. Coreg, Lisinopril.   6. BPH - cont. Flomax.   7. Depression - cont. Zoloft.     All the records are reviewed and case discussed with Care Management/Social Worker. Management plans discussed with the patient, family and they are in agreement.  CODE STATUS: Full code   DVT Prophylaxis: Hep SQ  TOTAL TIME TAKING CARE OF THIS PATIENT: 30 minutes.   POSSIBLE D/C IN 1-2 DAYS, DEPENDING ON CLINICAL CONDITION.   [place] SirenSAINANI,Franki Alcaide J M.D on 09/22/2017 at 2:41 PM  Between 7am to 6pm - Pager - [phone]  After 6pm go to [email or web address] - Therapist, nutritionalpassword EPAS [facility]  [facility] [place] [facility]  Office  [phone]  CC: Primary care physician; Center, [facility]

## 2017-09-22 NOTE — Progress Notes (Addendum)
Inpatient Diabetes Program Recommendations  AACE/ADA: New Consensus Statement on Inpatient Glycemic Control (2019)  Target Ranges:  Prepandial:   less than 140 mg/dL      Peak postprandial:   less than 180 mg/dL (1-2 hours)      Critically ill patients:  140 - 180 mg/dL   Results for Taylor Stephenson, Taylor Stephenson (MRN [id number]) as of 09/22/2017 11:49  Ref. Range 09/21/2017 07:41 09/21/2017 12:02 09/21/2017 17:14 09/21/2017 21:07 09/22/2017 08:01 09/22/2017 11:07  Glucose-Capillary Latest Ref Range: 70 - 99 mg/dL 409268 (H) 811313 (H) 914328 (H) 333 (H) 262 (H) 298 (H)   Review of Glycemic Control  Diabetes history: DM2 Outpatient Diabetes medications: Lantus 40 units BID, Regular 20 units TID Current orders for Inpatient glycemic control: Lantus 60 units QHS, Novolog 0-15 units TID with meals  Inpatient Diabetes Program Recommendations: Insulin - Basal: Please consider increasing Lantus to 65 units QHS. Insulin - Meal Coverage: Please consider ordering Novolog 6 units TID with meals for meal coverage if patient eats at least 50% of meals. Insulin-Correction: Please consider ordering Novolog 0-5 units QHS for bedtime correction.  Thanks, Orlando PennerMarie Yoshino Broccoli, RN, MSN, CDE Diabetes Coordinator Inpatient Diabetes Program [phone] (Team Pager from 8am to 5pm)

## 2017-09-22 NOTE — Progress Notes (Signed)
Hypoglycemic Event  CBG: 57   Treatment: OJ  Symptoms: Hungry  Follow-up CBG: Time:1749 CBG Result:52  Possible Reasons for Event: Unknown  Comments/MD notified Per Dr. Cherlynn KaiserSainani hold Lantus tonight and give D50 Follow up CBG at 18:27was  109. Will continue to monitor.    Rigoberto NoelErica Y Andrick Rust

## 2017-09-22 NOTE — Progress Notes (Addendum)
Progress Note  Patient Name: Taylor Stephenson Date of Encounter: 09/22/2017  Primary Cardiologist: [facility] (seen by Dr. Mariah Milling during admission 07/2017)  Subjective   SOB much improved. No chest pain. Notes constipation, last BM on the morning of 7/15. Documented UOP of 3.4 L for the past 24 hours and a net - 3.8 L for the admission. Weight down from 307-->302 pounds. Renal function trended up from 1.65-->1.84 this morning. Potassium 4.4.   Inpatient Medications    Scheduled Meds: . acidophilus  1 capsule Oral Daily  . aspirin EC  81 mg Oral Daily  . atorvastatin  40 mg Oral QHS  . carvedilol  3.125 mg Oral BID WC  . docusate sodium  100 mg Oral BID  . fluticasone furoate-vilanterol  1 puff Inhalation Daily  . furosemide  80 mg Intravenous BID  . heparin  5,000 Units Subcutaneous Q8H  . insulin aspart  0-15 Units Subcutaneous TID WC  . insulin glargine  20 Units Subcutaneous Once  . insulin glargine  60 Units Subcutaneous QHS  . lisinopril  2.5 mg Oral Daily  . pantoprazole  40 mg Oral Daily  . [START ON 09/23/2017] potassium chloride SA  20 mEq Oral Daily  . sertraline  50 mg Oral Daily  . tamsulosin  0.4 mg Oral Daily   Continuous Infusions:  PRN Meds: acetaminophen **OR** acetaminophen, ammonium lactate, ondansetron **OR** ondansetron (ZOFRAN) IV   Vital Signs    Vitals:   09/21/17 2300 09/22/17 0404 09/22/17 0804 09/22/17 0920  BP:  (!) 114/48 (!) 108/59 (!) 104/51  Pulse: 67 61 (!) 59 63  Resp: 18 18 18 18   Temp:  97.9 F (36.6 C) (!) 97.5 F (36.4 C)   TempSrc:   Oral   SpO2: 96% 96% 95% 93%  Weight:  (!) 302 lb 9.6 oz (137.3 kg)    Height:        Intake/Output Summary (Last 24 hours) at 09/22/2017 1009 Last data filed at 09/22/2017 0700 Gross per 24 hour  Intake 480 ml  Output 3900 ml  Net -3420 ml   Filed Weights   09/20/17 2044 09/21/17 0437 09/22/17 0404  Weight: (!) 310 lb (140.6 kg) (!) 307 lb 3.2 oz (139.3 kg) (!) 302 lb 9.6 oz (137.3 kg)     Telemetry    NSR - Personally Reviewed  ECG    n/a - Personally Reviewed  Physical Exam   GEN: No acute distress.   Neck: JVD difficult to assess given body habitus. Cardiac: RRR, I/VI systolic murmur, no rubs, or gallops.  Respiratory: Diminsihed breath sounds bilaterally.  GI: Soft, nontender, non-distended.   MS: 1+ bilateral pitting edema; No deformity. Neuro:  Alert and oriented x 3; Nonfocal.  Psych: Normal affect.  Labs    Chemistry Recent Labs  Lab 09/20/17 2051 09/22/17 0548  NA 133* 134*  K 4.8 4.4  CL 104 101  CO2 19* 26  GLUCOSE 176* 317*  BUN 41* 53*  CREATININE 1.65* 1.84*  CALCIUM 8.6* 8.5*  GFRNONAA 40* 35*  GFRAA 46* 40*  ANIONGAP 10 7     Hematology Recent Labs  Lab 09/20/17 2051  WBC 8.0  RBC 3.41*  HGB 10.6*  HCT 31.2*  MCV 91.4  MCH 31.2  MCHC 34.2  RDW 18.3*  PLT 180    Cardiac Enzymes Recent Labs  Lab 09/20/17 2051  TROPONINI <0.03   No results for input(s): TROPIPOC in the last 168 hours.   BNP Recent  Labs  Lab 09/20/17 2051  BNP 555.0*     DDimer No results for input(s): DDIMER in the last 168 hours.   Radiology    Dg Chest 2 View  Result Date: 09/20/2017 IMPRESSION: Stable cardiomegaly with aortic atherosclerosis. Interval increase in interstitial edema consistent with CHF. Electronically Signed   By: Tollie Eth M.D.   On: 09/20/2017 21:17    Cardiac Studies   07/15/2017: Study Conclusions  - Left ventricle: The cavity size was normal. There was moderate concentric hypertrophy. Systolic function was normal. The estimated ejection fraction was in the range of 55% to 60%. Wall motion was normal; there were no regional wall motion abnormalities. Features are consistent with a pseudonormal left ventricular filling pattern, with concomitant abnormal relaxation and increased filling pressure (grade 2 diastolic dysfunction). - Aortic valve: There was very mild stenosis. There was  mild regurgitation. - Mitral valve: Calcified annulus. Mildly thickened leaflets . There was mild regurgitation. - Left atrium: The atrium was mildly dilated. - Right ventricle: The cavity size was moderately dilated. Wall thickness was normal. Systolic function was moderately reduced. - Pulmonary arteries: Systolic pressure could not be accurately estimated.  Patient Profile     72 y.o. 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  Assessment & Plan    1. Acute on chronic diastolic CHF: -Improved -Bump in SCr from 1.65-->1.84 -Has already received IV Lasix this morning, hold PM dose -Look to resume PO Lasix on 7/18 at PTA 40 mg bid -Coreg  -Recent echo as above, no need to repeat at this time -CHF education -Daily weights, strict Is and Os -Needs to follow up with [facility] as an [place] [facility]) -Would hold on any further IV steroids as this is not a COPD exacerbation and will lead to fluid retention   2. Bioprosthetic AVR: -Followed by  Hospital cardiology -Recent echo as above  3. Acute on CKD stage III: -Has already received IV Lasix this morning, hold PM dose -Will need follow up bmet in 24-48 hours as outpatient if he is discharged today  4. Morbid obesity/OSA/possible OHS: -Weight loss advised -CPAP -Recommend dietitian as an outpatient  5. IDDM: -Per IM  6. Anemia of chronic disease: -Per IM  7. Constipation: -Per IM   For questions or updates, please contact [facility] Please consult [email or web address] for contact info under Cardiology/STEMI.    Signed, Eula Listen, PA-C [facility] Pager: [phone] 09/22/2017, 10:09  AM   Attending Note Patient seen and examined, agree with detailed note above,  Patient presentation and plan discussed on rounds.   EKG lab work, chest x-ray, echocardiogram reviewed independently by myself  Long discussion with him today concerning recent weight gain Takes Lasix 40 in the morning Often missing his evening dose as he is the member of the household but does the chores High fluid intake lots of coffee iced tea water Poor diet  Feel somewhat better but still not near his baseline Still with flank edema,distended abdomen,  edema up his thighs  On physical exam unable to estimate JVD given obesity, lungs with crackles at the bases otherwise clear, heart sounds regular no murmurs appreciated, abdomen obese soft nontender mild pitting edema  on the flanks, 1+ lower extremity edema to the thighs  Lab work reviewed showing creatinine 1.84 BUN 53 potassium 4.4 glucose 317  A/P:  1. Acute on chronic diastolic CHF: Started CHF  Education with him Suspect he may need torsemide 40 twice a day at discharge He is missing doses in the afternoon given he has these he needs to do High fluid intake and poor diet Will continue education in the next few days Continue Coreg and lisinopril  2. Bioprosthetic AVR: -[facility] -Recent echo as above Likely contributing to over tension  3. Acute on CKD stage III: Given a climbing creatinine up to 1.8 we may need to slow the rate of diuresis Close monitoring of renal function  4. Morbid obesity/OSA/possible OHS: -CPAP -Recommend dietitian as an outpatient  5. IDDM: poorly controlled numbers Will defer to the primary care physician  6. Anemia of chronic disease: hematocrit 31, decline in the past 2 years  7. Constipation: sedentary  Long discussion with him concerning CHF management Greater than 50% was spent in counseling and coordination of care with patient Total encounter time 35 minutes or  more   Signed: Dossie Arbourim Gollan  M.D., Ph.D. [facility]

## 2017-09-22 NOTE — Plan of Care (Signed)
  Problem: Education: Goal: Knowledge of General Education information will improve Outcome: Progressing   Problem: Health Behavior/Discharge Planning: Goal: Ability to manage health-related needs will improve Outcome: Progressing   Problem: Clinical Measurements: Goal: Ability to maintain clinical measurements within normal limits will improve Outcome: Progressing Goal: Will remain free from infection Outcome: Progressing Goal: Diagnostic test results will improve Outcome: Progressing Goal: Respiratory complications will improve Outcome: Progressing Goal: Cardiovascular complication will be avoided Outcome: Progressing   Problem: Activity: Goal: Risk for activity intolerance will decrease Outcome: Progressing   Problem: Nutrition: Goal: Adequate nutrition will be maintained Outcome: Progressing   Problem: Coping: Goal: Level of anxiety will decrease Outcome: Progressing   Problem: Elimination: Goal: Will not experience complications related to bowel motility Outcome: Progressing Goal: Will not experience complications related to urinary retention Outcome: Progressing   Problem: Pain Managment: Goal: General experience of comfort will improve Outcome: Progressing   Problem: Safety: Goal: Ability to remain free from injury will improve Outcome: Progressing   Problem: Skin Integrity: Goal: Risk for impaired skin integrity will decrease Outcome: Progressing   Problem: Education: Goal: Knowledge of General Education information will improve Outcome: Progressing   Problem: Health Behavior/Discharge Planning: Goal: Ability to manage health-related needs will improve Outcome: Progressing   Problem: Clinical Measurements: Goal: Ability to maintain clinical measurements within normal limits will improve Outcome: Progressing Goal: Will remain free from infection Outcome: Progressing Goal: Diagnostic test results will improve Outcome: Progressing Goal:  Respiratory complications will improve Outcome: Progressing Goal: Cardiovascular complication will be avoided Outcome: Progressing   Problem: Activity: Goal: Risk for activity intolerance will decrease Outcome: Progressing   Problem: Nutrition: Goal: Adequate nutrition will be maintained Outcome: Progressing   Problem: Coping: Goal: Level of anxiety will decrease Outcome: Progressing   Problem: Elimination: Goal: Will not experience complications related to bowel motility Outcome: Progressing Goal: Will not experience complications related to urinary retention Outcome: Progressing   Problem: Pain Managment: Goal: General experience of comfort will improve Outcome: Progressing   Problem: Safety: Goal: Ability to remain free from injury will improve Outcome: Progressing   Problem: Skin Integrity: Goal: Risk for impaired skin integrity will decrease Outcome: Progressing   Problem: Education: Goal: Ability to verbalize understanding of medication therapies will improve Outcome: Progressing   Problem: Activity: Goal: Capacity to carry out activities will improve Outcome: Progressing   Problem: Cardiac: Goal: Ability to achieve and maintain adequate cardiopulmonary perfusion will improve Outcome: Progressing

## 2017-09-22 NOTE — Evaluation (Signed)
Physical Therapy Evaluation Patient Details Name: Taylor Stephenson MRN: [id number] DOB: [birth year] Today's Date: 09/22/2017   History of Present Illness  72 year old male admitted for CHF exacerbation history of bioprosthetic AVR, nonobstructive CAD, HFpEF, CKD stage II-III, anemia of chronic disease, diabetes mellitus, hypertension, COPD, morbid obesity, and OSA on CPAP  Clinical Impression  Patient A&Ox4 at start of session, no complaints of pain. Patient reports prior to admission that he ambulates with a cane in home and community, though does state he reaches for walls/furniture for additional support. States a walker is hard to move around in his home due to space issues. Patient also states independent with IADLs/ADLs. The patient was able to mobilize to EOB with mod I, transfers with supervision, and able to ambulate to commode and chair without AD and carrying foley bag, ~60ft with supervision.The patient would benefit from one follow up visit to ensure return to baseline ambulation level.     Follow Up Recommendations Home health PT    Equipment Recommendations  None recommended by PT    Recommendations for Other Services       Precautions / Restrictions Precautions Precautions: Fall Restrictions Weight Bearing Restrictions: No      Mobility  Bed Mobility Overal bed mobility: Modified Independent                Transfers Overall transfer level: Needs assistance   Transfers: Sit to/from Stand Sit to Stand: Supervision         General transfer comment: Patient did use hand rails when performing transfer but completed transfer and demonstrated good standing balance without support  Ambulation/Gait   Gait Distance (Feet): 22 Feet Assistive device: None Gait Pattern/deviations: Wide base of support;WFL(Within Functional Limits)     General Gait Details: Patient able to carry foley bag while ambulating, reaching for support with other UE, no LOB, no  complaints of dizziness/fatigue  Stairs            Wheelchair Mobility    Modified Rankin (Stroke Patients Only)       Balance Overall balance assessment: Mild deficits observed, not formally tested                                           Pertinent Vitals/Pain Pain Assessment: No/denies pain    Home Living Family/patient expects to be discharged to:: Private residence Living Arrangements: Spouse/significant other Available Help at Discharge: Family;Other (Comment)(wife works during the day) Type of Home: House Home Access: Stairs to enter Entrance Stairs-Rails: Left Entrance Stairs-Number of Steps: 5-6 STE Home Layout: One level Home Equipment: Shower seat;Cane - single point;Cane - quad      Prior Function Level of Independence: Independent with assistive device(s)         Comments: reports using cane in home and in community, Does states he was holding onto furniture at home as well. Driving, cooking and cleaning, though cooking/cleaning less and less     Hand Dominance   Dominant Hand: Right    Extremity/Trunk Assessment   Upper Extremity Assessment Upper Extremity Assessment: Overall WFL for tasks assessed    Lower Extremity Assessment Lower Extremity Assessment: Overall WFL for tasks assessed       Communication   Communication: No difficulties  Cognition Arousal/Alertness: Awake/alert Behavior During Therapy: WFL for tasks assessed/performed  General Comments      Exercises     Assessment/Plan    PT Assessment Patient needs continued PT services  PT Problem List Cardiopulmonary status limiting activity;Decreased strength;Decreased mobility;Decreased balance;Decreased activity tolerance       PT Treatment Interventions DME instruction;Therapeutic exercise;Gait training;Balance training;Stair training;Neuromuscular re-education;Therapeutic  activities;Patient/family education    PT Goals (Current goals can be found in the Care Plan section)  Acute Rehab PT Goals Patient Stated Goal: Patient would like to return to PLOF PT Goal Formulation: With patient Time For Goal Achievement: 10/06/17 Potential to Achieve Goals: Good    Frequency Min 2X/week   Barriers to discharge        Co-evaluation               AM-PAC PT "6 Clicks" Daily Activity  Outcome Measure Difficulty turning over in bed (including adjusting bedclothes, sheets and blankets)?: None Difficulty moving from lying on back to sitting on the side of the bed? : A Little Difficulty sitting down on and standing up from a chair with arms (e.g., wheelchair, bedside commode, etc,.)?: A Little Help needed moving to and from a bed to chair (including a wheelchair)?: None Help needed walking in hospital room?: A Little Help needed climbing 3-5 steps with a railing? : A Little 6 Click Score: 20    End of Session Equipment Utilized During Treatment: Gait belt Activity Tolerance: Patient tolerated treatment well Patient left: in chair;with chair alarm set;with call bell/phone within reach Nurse Communication: Mobility status PT Visit Diagnosis: Other abnormalities of gait and mobility (R26.89);Muscle weakness (generalized) (M62.81)    Time: [id number] PT Time Calculation (min) (ACUTE ONLY): 20 min   Charges:   PT Evaluation $PT Eval Low Complexity: 1 Low     PT G CodesOlga Coaster:       Maekayla Giorgio PT, DPT 3:33 PM,09/22/17 [phone]

## 2017-09-23 DIAGNOSIS — Z9989 Dependence on other enabling machines and devices: Secondary | ICD-10-CM

## 2017-09-23 DIAGNOSIS — G4733 Obstructive sleep apnea (adult) (pediatric): Secondary | ICD-10-CM

## 2017-09-23 LAB — CBC
HCT: 30.8 % — ABNORMAL LOW (ref 40.0–52.0)
HEMOGLOBIN: 10.4 g/dL — AB (ref 13.0–18.0)
MCH: 30.5 pg (ref 26.0–34.0)
MCHC: 33.7 g/dL (ref 32.0–36.0)
MCV: 90.6 fL (ref 80.0–100.0)
Platelets: 200 10*3/uL (ref 150–440)
RBC: 3.4 MIL/uL — AB (ref 4.40–5.90)
RDW: 18.4 % — ABNORMAL HIGH (ref 11.5–14.5)
WBC: 6.7 10*3/uL (ref 3.8–10.6)

## 2017-09-23 LAB — BASIC METABOLIC PANEL
ANION GAP: 10 (ref 5–15)
BUN: 55 mg/dL — ABNORMAL HIGH (ref 8–23)
CALCIUM: 8.7 mg/dL — AB (ref 8.9–10.3)
CO2: 25 mmol/L (ref 22–32)
Chloride: 100 mmol/L (ref 98–111)
Creatinine, Ser: 1.5 mg/dL — ABNORMAL HIGH (ref 0.61–1.24)
GFR, EST AFRICAN AMERICAN: 52 mL/min — AB (ref >60)
GFR, EST NON AFRICAN AMERICAN: 44 mL/min — AB (ref >60)
Glucose, Bld: 170 mg/dL — ABNORMAL HIGH (ref 70–99)
Potassium: 4.4 mmol/L (ref 3.5–5.1)
Sodium: 135 mmol/L (ref 135–145)

## 2017-09-23 LAB — GLUCOSE, CAPILLARY
Glucose-Capillary: 177 mg/dL — ABNORMAL HIGH (ref 70–99)
Glucose-Capillary: 182 mg/dL — ABNORMAL HIGH (ref 70–99)
Glucose-Capillary: 256 mg/dL — ABNORMAL HIGH (ref 70–99)
Glucose-Capillary: 254 mg/dL — ABNORMAL HIGH (ref 70–99)
Glucose-Capillary: 271 mg/dL — ABNORMAL HIGH (ref 70–99)

## 2017-09-23 MED ORDER — TORSEMIDE 20 MG PO TABS
40.0000 mg | ORAL_TABLET | Freq: Every day | ORAL | Status: DC
  Administered 2017-09-23 – 2017-09-24 (×2): 40 mg via ORAL
  Filled 2017-09-23 (×2): qty 2

## 2017-09-23 MED ORDER — POTASSIUM CHLORIDE CRYS ER 20 MEQ PO TBCR
20.0000 meq | EXTENDED_RELEASE_TABLET | Freq: Every day | ORAL | Status: DC
  Administered 2017-09-24: 20 meq via ORAL
  Filled 2017-09-23: qty 1

## 2017-09-23 MED ORDER — INSULIN GLARGINE 100 UNIT/ML ~~LOC~~ SOLN
65.0000 [IU] | Freq: Every day | SUBCUTANEOUS | Status: DC
  Administered 2017-09-23: 65 [IU] via SUBCUTANEOUS
  Filled 2017-09-23 (×2): qty 0.65

## 2017-09-23 MED ORDER — SODIUM CHLORIDE 0.9% FLUSH
3.0000 mL | Freq: Two times a day (BID) | INTRAVENOUS | Status: DC
  Administered 2017-09-23 – 2017-09-24 (×3): 3 mL via INTRAVENOUS

## 2017-09-23 NOTE — Care Management (Signed)
Physical therapy evaluation completed. Recommending home with home health/physical therapy. [facility]ed that [facility] Community education officercontract with [employer]  States as long as [facility] has a Community education officercontract he is O.K with this health agency [place] pending on his blood sugars. Gwenette GreetBrenda S Giang Hemme RN MSN CCM Care Management [phone]

## 2017-09-23 NOTE — Progress Notes (Addendum)
Inpatient Diabetes Program Recommendations  AACE/ADA: New Consensus Statement on Inpatient Glycemic Control (2019)  Target Ranges:  Prepandial:   less than 140 mg/dL      Peak postprandial:   less than 180 mg/dL (1-2 hours)      Critically ill patients:  140 - 180 mg/dL   Results for Taylor Stephenson, Taylor Stephenson (MRN [id number]) as of 09/23/2017 09:20  Ref. Range 09/22/2017 08:01 09/22/2017 11:07 09/22/2017 16:51 09/22/2017 17:26 09/22/2017 17:49 09/22/2017 18:27 09/22/2017 21:28 09/23/2017 04:00 09/23/2017 07:54  Glucose-Capillary Latest Ref Range: 70 - 99 mg/dL 262 (H)  Novolog 8 units 298 (H)  Novolog 8 units 74  Lantus 40 units _0 :24 57 (L) 52 (L) 109 (H) 134 (H) 177 (H) 182 (H)   Results for Taylor Stephenson, Taylor Stephenson (MRN [id number]) as of 09/23/2017 09:20  Ref. Range 09/21/2017 07:41 09/21/2017 12:02 09/21/2017 17:14 09/21/2017 21:07  Glucose-Capillary Latest Ref Range: 70 - 99 mg/dL 268 (H)  Novolog 8 units 313 (H)  Novolog 11 units 328 (H)  Novolog 11 units 333 (H)  Lantus 60 units_1 :42   Review of Glycemic Control  Diabetes history: DM2 Outpatient Diabetes medications: Lantus 40 units BID, Regular 20 units TID Current orders for Inpatient glycemic control: Novolog 0-15 units TID with meals  Inpatient Diabetes Program Recommendations: Insulin - Basal: Patient received Lantus 60 units on 09/21/17_2 :42 and then received Lantus 40 units on 09/23/17 @ 15:24. Anticipate hypoglycemia noted yesterday afternoon was due to getting the Lantus 40 units (for total of 100 units within 18 hours). Noted Lantus was discontinued completely. Anticipate patient will need basal insulin reordered.  Please consider re-ordering Lantus at 65 units QHS (to start at bedtime today). Insulin-Correction: Please consider ordering Novolog 0-5 units QHS for bedtime correction. Insulin - Meal Coverage: If post prandial glucose is consistently greater than 180 mg/dl, please consider ordering Novolog 5 units TID with meals for meal coverage  if patient eats at least 50% of meals.  Addendum 09/23/17_3 :15-Spoke with patient about diabetes and home regimen for diabetes control. Patient reports that he is followed by Endocrinologist at [facility] for diabetes management. Patient states that he just went to see Endocrinologist at [facility] this week and he was instructed to decrease Lantus to 40 units BID (was taking Lantus 45 units QAM and Lantus 50 units QHS) and continue Regular 20 units with meals. Patient states that he did not have the opportunity to take the decreased dose of Lantus since he was admitted to the hospital.  Patient states that he checks his glucose 3 times per day and that it is usually ranges from 150-300 mg/dl. Patient states that he has hypoglycemia about once every 2 weeks and he is able to recognize hypoglycemia once the glucose gets in the 50's mg/dl. Patient reports that he had a significant hypoglycemic event about 2 weeks ago around 4 am and he had to be given glucose gel by his wife. Discussed last A1C results in the chart (7.4% on 07/14/17) and explained that A1C indicates an average glucose of 166 mg/dl. Explained that if glucose is usually 150-300 mg/dl, it could be that patient is experiencing hypoglycemia at times that he is not recognizing which can make the A1C indicate a good glucose average.  Discussed glucose and A1C goals. Patient reports that his doctor has told him that his glucose targets should be 150-250 mg/dl to decrease risk of hypoglycemia.  Discussed importance of checking CBGs and maintaining good CBG control to prevent long-term and short-term complications. Patient reports that he tries  to follow a carb modified diet and that he has started going to the pool for exercise when he is able to get out and go. Inquired about knowledge about FreeStyle Libre (flash glucose monitoring sensor) and patient states that when he met with his Endocrinologist this week his doctor told him that they would start the approval process to  get the [place] for him.  Discussed FreeStyle Libre in more detail and explained how it can help improve DM control and allow him to have more glucose readings to determine how glucose is trending. Encouraged patient to make the changes as his Endocrinologist as has him to make and to continuing checking his glucose 3-4 times per day and to keep a log book of glucose readings and insulin taken.  Also encouraged patient to follow up with Endocrinologist about FreeStyle Libre.  Patient verbalized understanding of information discussed and he states that he has no further questions at this time related to diabetes.  Thanks, Barnie Alderman, RN, MSN, CDE Diabetes Coordinator Inpatient Diabetes Program [phone] (Team Pager from 8am to 5pm)

## 2017-09-23 NOTE — Progress Notes (Addendum)
Physical Therapy Treatment Patient Details Name: Taylor Stephenson MRN: [id number] DOB: 03-23-1945 Today's Date: 09/23/2017    History of Present Illness 72 year old male admitted for CHF exacerbation history of bioprosthetic AVR, nonobstructive CAD, HFpEF, CKD stage II-III, anemia of chronic disease, diabetes mellitus, hypertension, COPD, morbid obesity, and OSA on CPAP    PT Comments    Patient alert and agreeable to PT, no pain at start of session. Patient mobilized to EOB mod I and performed transfers with supervision and RW. Patient ambulated ~25100ft with RW and supervision, 1-2 standing rest breaks due to SOB/fatigue. Patient in chair with all needs in reach, resolving SOB, no complaints. Patient states that he is more fatigued/needed more breaks than usual compared to baseline. Vitals stable throughout mobilization. The patient demonstrates decreased endurance/activity tolerance and would benefit from further skilled PT to return to PLOF.     Follow Up Recommendations  Home health PT     Equipment Recommendations  None recommended by PT    Recommendations for Other Services       Precautions / Restrictions      Mobility  Bed Mobility Overal bed mobility: Modified Independent                Transfers Overall transfer level: Needs assistance   Transfers: Sit to/from Stand Sit to Stand: Supervision            Ambulation/Gait   Gait Distance (Feet): 200 Feet Assistive device: Rolling walker (2 wheeled) Gait Pattern/deviations: Wide base of support;WFL(Within Functional Limits)     General Gait Details: Patient needed 1-2 standing rest breaks, spO2>90% througout ambulation, HR stable   Stairs             Wheelchair Mobility    Modified Rankin (Stroke Patients Only)       Balance                                            Cognition                                              Exercises      General  Comments        Pertinent Vitals/Pain Pain Assessment: No/denies pain    Home Living                      Prior Function            PT Goals (current goals can now be found in the care plan section) Progress towards PT goals: Progressing toward goals    Frequency    Min 2X/week      PT Plan Current plan remains appropriate    Co-evaluation              AM-PAC PT "6 Clicks" Daily Activity  Outcome Measure  Difficulty turning over in bed (including adjusting bedclothes, sheets and blankets)?: None Difficulty moving from lying on back to sitting on the side of the bed? : A Little Difficulty sitting down on and standing up from a chair with arms (e.g., wheelchair, bedside commode, etc,.)?: A Little Help needed moving to and from a bed to chair (including a wheelchair)?: None Help needed walking in hospital room?: A Little Help  needed climbing 3-5 steps with a railing? : A Little 6 Click Score: 20    End of Session Equipment Utilized During Treatment: Gait belt Activity Tolerance: Patient tolerated treatment well Patient left: in chair;with chair alarm set;with call bell/phone within reach Nurse Communication: Mobility status PT Visit Diagnosis: Other abnormalities of gait and mobility (R26.89);Muscle weakness (generalized) (M62.81)     Time: 1610-9604 PT Time Calculation (min) (ACUTE ONLY): 21 min  Charges:  $Therapeutic Activity: 8-22 mins                    G Codes:       Olga Coaster PT, DPT 1:48 PM,09/23/17 [phone]

## 2017-09-23 NOTE — Progress Notes (Signed)
[facility] [place] [facility] [place]   PATIENT NAME: Taylor Stephenson    MR#:  [id number]  DATE OF BIRTH:  September 25, 1945  SUBJECTIVE:   Doing well this morning. Shortness of breath has improved. Still having some swelling in his legs. No chest pain. Had some episodes of hypoglycemia last night with blood sugars in the 50s. Improved with 1 amp of D50.  REVIEW OF SYSTEMS:    Review of Systems  Constitutional: Negative for chills and fever.  HENT: Negative for congestion and tinnitus.   Eyes: Negative for blurred vision and double vision.  Respiratory: Negative for cough, shortness of breath and wheezing.   Cardiovascular: Positive for leg swelling. Negative for chest pain, orthopnea and PND.  Gastrointestinal: Negative for abdominal pain, diarrhea, nausea and vomiting.  Genitourinary: Negative for dysuria and hematuria.  Neurological: Negative for dizziness, sensory change and focal weakness.  All other systems reviewed and are negative.   Nutrition: Heart Healthy/Carb control Tolerating Diet: Yes Tolerating PT: HHPT  DRUG ALLERGIES:   Allergies  Allergen Reactions  . Latex     VITALS:  Blood pressure (!) 124/56, pulse 70, temperature 98 F (36.7 C), temperature source Oral, resp. rate 19, height 5\' 7"  (1.702 m), weight (!) 137.3 kg (302 lb 12.8 oz), SpO2 94 %.  PHYSICAL EXAMINATION:   Physical Exam  GENERAL:  72 y.o.-year-old obese patient laying in bed, in NAD EYES: PERRLA, EOMI, no scleral icterus HEENT: [place]/AT, oropharynx clear NECK:  Supple, no JVD. No thyroid enlargement, no tenderness.  LUNGS: Normal work of breathing, no wheezing or crackles CARDIOVASCULAR: RRR, no murmurs ABDOMEN: Soft, nontender, nondistended. +BS EXTREMITIES: No cyanosis, clubbing, 1+ pitting edema to the mid shin bilaterally, +venous stasis changes NEUROLOGIC: CN 2-12 intact, no focal deficits.  PSYCHIATRIC: A&O x 3, appropriate affect, normal behavior. SKIN: No obvious rash,  lesion, or ulcer.  +venous stasis changes in the lower extremities bilaterally   LABORATORY PANEL:   CBC Recent Labs  Lab 09/23/17 0454  WBC 6.7  HGB 10.4*  HCT 30.8*  PLT 200   ------------------------------------------------------------------------------------------------------------------  Chemistries  Recent Labs  Lab 09/23/17 0454  NA 135  K 4.4  CL 100  CO2 25  GLUCOSE 170*  BUN 55*  CREATININE 1.50*  CALCIUM 8.7*   ------------------------------------------------------------------------------------------------------------------  Cardiac Enzymes Recent Labs  Lab 09/20/17 2051  TROPONINI <0.03   ------------------------------------------------------------------------------------------------------------------  RADIOLOGY:  No results found.   ASSESSMENT AND PLAN:   72 year old male with past medical history of diabetes, hypertension, morbid obesity, obstructive sleep apnea, chronic diastolic CHF who presents to the hospital due to worsening shortness of breath and lower extremity edema.  1.  CHF-acute on chronic diastolic dysfunction. - weight down 5lbs, -1.2L in the last 24 hours (net -5L). - switched from IV lasix to torsemide 40mg  daily per cards - continue coreg and lisinopril  2.  CKD stage III- Cr bumped yesterday from 1.65 > 1.84, but improved to 1.50 thsi morning after holding PM dose of IV Lasix. - recheck bmp tomorrow  3.  Diabetes type 2 without complication- had some episodes of hypoglycemia last night with blood sugars in the 50s after receiving Lantus 60 units in the am and 40 units in the pm (home dose is 40 units bid). Received 1 amp D50, which improved his sugars. - lantus 65 units qhs - moderate SSI - monitor blood sugars closely - diabetes coordinator following, appreciate recommendations  4.  Obstructive sleep apnea- stable - CPAP qhs  5.  Essential HTN - well-controlled - continue home coreg and lisinopril   6. BPH - stable -  continue flomax  7. Depression - stable - continue home zoloft   All the records are reviewed and case discussed with Care Management/Social Worker. Management plans discussed with the patient, family and they are in agreement.  CODE STATUS: Full code   DVT Prophylaxis: Hep SQ  TOTAL TIME TAKING CARE OF THIS PATIENT: 30 minutes.   POSSIBLE D/C TOMORROW, DEPENDING ON CLINICAL CONDITION.   Jinny BlossomKaty D Janice Bodine M.D on 09/23/2017 at 8:53 AM  Between 7am to 6pm - Pager - [phone]  After 6pm go to [email or web address] - Therapist, nutritionalpassword EPAS [facility]  [facility] [place] [facility]  Office  [phone]  CC: Primary care physician; [facility]

## 2017-09-23 NOTE — Progress Notes (Signed)
72 year old male admitted for CHF exacerbation history of bioprosthetic AVR, nonobstructive CAD, HFpEF, CKD stage II-III, anemia of chronic disease, diabetes mellitus, hypertension, COPD, morbid obesity, and OSA on CPAP   Patient's last echo was performed on 07/15/17 which revealed LV EF of 55-60%    Rounded on patient. Patient sitting up in bed.  Educational session with patient completed.   NOTE:  CHF is not a new diagnosis for patient.   Provided patient with "Living Better with Heart Failure" packet. Briefly reviewed definition of heart failure and signs and symptoms of an exacerbation.?Explained to patient that HF is a chronic illness which requires self-assessment / self-management along with help from the cardiologist/PCP.??   Reviewed importance of and reason behind checking weight daily in the AM, after using the bathroom, but before getting dressed.  Patient has scales.  Patient informed me that he weighs himself every morning.   ? Reviewed the following information with patient:  *Discussed when to call the Dr= weight gain of >2-3lb overnight of 5lb in a week,  *Discussed yellow zone= call MD: weight gain of >2-3lb overnight of 5lb in a week, increased swelling, increased SOB when lying down, chest discomfort, dizziness, increased fatigue *Red Zone= call 911: struggle to breath, fainting or near fainting, significant chest pain    *Reviewed low sodium diet-provided handout of recommended and not recommended foods.  Reviewed reading labels with patient.  Patient informed me that he reads labels thoroughly and provided an example.   Discussed fluid intake with patient as well. Patient currently on a 1500 ml fluid restriction.  Patient was able to demonstrate this amount using the bedside water pitcher.   ? Instructed patient to take medications as prescribed for heart failure. Explained briefly why pt is on the medications (either make you feel better, live longer or keep you out of the  hospital) and discussed monitoring and side effects. Patient currently on carvedilol, lisinopril and furosemide   *Smoking Cessation - Patient is a former smoker.   *Discussed exercise.  He mentioned to me that he tries to walk but must use a cain and gets exhausted very easily   [facility] - Patient is known to the HF Clinic.  Patient has f/u 7/25    Once again, I reviewed the 5 Steps to Living Better with Heart Failure.

## 2017-09-23 NOTE — Progress Notes (Addendum)
Progress Note  Patient Name: Taylor GaribaldiJackie Stephenson Date of Encounter: 09/23/2017  [facility]  Subjective   Still feels like breathing is "shallow."  Had a hypoglycemic episode last night following sliding scale and meal coverage.  Adjustments made to regimen.  No chest pain.  Inpatient Medications    Scheduled Meds: . acidophilus  1 capsule Oral Daily  . aspirin EC  81 mg Oral Daily  . atorvastatin  40 mg Oral QHS  . carvedilol  3.125 mg Oral BID WC  . docusate sodium  100 mg Oral BID  . fluticasone furoate-vilanterol  1 puff Inhalation Daily  . heparin  5,000 Units Subcutaneous Q8H  . insulin aspart  0-15 Units Subcutaneous TID WC  . lisinopril  2.5 mg Oral Daily  . pantoprazole  40 mg Oral Daily  . [START ON 09/24/2017] potassium chloride SA  20 mEq Oral Daily  . protein supplement shake  11 oz Oral BID BM  . sertraline  50 mg Oral Daily  . sodium chloride flush  3 mL Intravenous Q12H  . tamsulosin  0.4 mg Oral Daily   Continuous Infusions:  PRN Meds: acetaminophen **OR** acetaminophen, ammonium lactate, lactulose, ondansetron **OR** ondansetron (ZOFRAN) IV   Vital Signs    Vitals:   09/22/17 1942 09/22/17 2319 09/23/17 0402 09/23/17 0757  BP: (!) 130/53  122/62 (!) 124/56  Pulse: 75 74 69 70  Resp: 18 16 16 19   Temp: 98.3 F (36.8 C)  98 F (36.7 C)   TempSrc: Oral  Oral   SpO2: 100% 100% 95% 94%  Weight:   (!) 302 lb 12.8 oz (137.3 kg)   Height:        Intake/Output Summary (Last 24 hours) at 09/23/2017 1013 Last data filed at 09/23/2017 1003 Gross per 24 hour  Intake 720 ml  Output 1850 ml  Net -1130 ml   Filed Weights   09/21/17 0437 09/22/17 0404 09/23/17 0402  Weight: (!) 307 lb 3.2 oz (139.3 kg) (!) 302 lb 9.6 oz (137.3 kg) (!) 302 lb 12.8 oz (137.3 kg)    Physical Exam   GEN: Well nourished, well developed, in no acute distress.  HEENT: Grossly normal.  Neck: Supple, obese, difficult to gauge JVP.  No carotid bruits, or  masses. Cardiac: RRR, 2/6 syst murmur @ upper sternal borders.  No rubs, or gallops. No clubbing, cyanosis.  1+ bilat ankle edema w/ posterior thigh and flank edema as well.  Radials/DP/PT 2+ and equal bilaterally.  Respiratory:  Respirations regular and unlabored, clear to auscultation bilaterally. GI: Protuberant, soft, nontender, 1+ flank edema noted.  BS + x 4. MS: no deformity or atrophy. Skin: warm and dry, no rash. Neuro:  Strength and sensation are intact. Psych: AAOx3.  Normal affect.  Labs    Chemistry Recent Labs  Lab 09/20/17 2051 09/22/17 0548 09/23/17 0454  NA 133* 134* 135  K 4.8 4.4 4.4  CL 104 101 100  CO2 19* 26 25  GLUCOSE 176* 317* 170*  BUN 41* 53* 55*  CREATININE 1.65* 1.84* 1.50*  CALCIUM 8.6* 8.5* 8.7*  GFRNONAA 40* 35* 44*  GFRAA 46* 40* 52*  ANIONGAP 10 7 10      Hematology Recent Labs  Lab 09/20/17 2051 09/23/17 0454  WBC 8.0 6.7  RBC 3.41* 3.40*  HGB 10.6* 10.4*  HCT 31.2* 30.8*  MCV 91.4 90.6  MCH 31.2 30.5  MCHC 34.2 33.7  RDW 18.3* 18.4*  PLT 180 200    Cardiac  Enzymes Recent Labs  Lab 09/20/17 2051  TROPONINI <0.03    BNP Recent Labs  Lab 09/20/17 2051  BNP 555.0*     Radiology    No results found.  Telemetry    RSR, 1st deg avb - Personally Reviewed  Cardiac Studies   2D Echocardiogram 07/15/2017  Study Conclusions   - Left ventricle: The cavity size was normal. There was moderate   concentric hypertrophy. Systolic function was normal. The   estimated ejection fraction was in the range of 55% to 60%. Wall   motion was normal; there were no regional wall motion   abnormalities. Features are consistent with a pseudonormal left   ventricular filling pattern, with concomitant abnormal relaxation   and increased filling pressure (grade 2 diastolic dysfunction). - Aortic valve: There was very mild stenosis. There was mild   regurgitation. - Mitral valve: Calcified annulus. Mildly thickened leaflets .   There  was mild regurgitation. - Left atrium: The atrium was mildly dilated. - Right ventricle: The cavity size was moderately dilated. Wall   thickness was normal. Systolic function was moderately reduced. - Pulmonary arteries: Systolic pressure could not be accurately   estimated.   Patient Profile     72 y.o. 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  Assessment & Plan    1.  Acute on chronic diastolic CHF:  Nl EF w/ Gr2 DD by echo in May.  Admitted 7/15 with progressive dyspnea and 12 lbs wt gain over 3-4 day period.  Responded well to IV lasix initially but bumped creat to 1.84 on 7/17, necessitating holding lasix.  Minus 1.2L overnight.  Minus 4.9L since admission.  Wt down 5 lbs since admission.  We don't really know dry wt.  He continues to have posterior leg and flank edema.  Suspect he might be better served w/ torsemide.  Will add 40mg  daily today.  Can consider giving another dose this PM depending on response.  F/u bmet in AM.  HR/BP stable.  Cont  blocker and acei.  2.  Bioprosthetic AVR:  Mild AS/AI by echo in May.  Follows @ [facility].  3.  Acute on chronic stage III kidney dzs:  Stable.  Follow w/ resumption of diuretics.  4.  OSA/OHS:  Uses CPAP.  Recently joined [employer].  5.  IDDM:  Insulin adjusted by IM last night following hypoglycemic episode.  6.  Anemia of chronic dzs: stable.  Signed, Nicolasa Ducking, NP  09/23/2017, 10:13 AM    For questions or updates, please contact   Please consult [email or web address] for contact info under Cardiology/STEMI.   Attending Note Patient seen and examined, agree with detailed note above,  Patient presentation and plan  discussed on rounds.   Still with significant flank edema, abdominal swelling shortness of breath Overall much improved though Continued discussion concerning his habits at home Often missing afternoon Lasix  On physical exam  unable to estimate JVD  lungs with crackles at the bases otherwise clear,  heart sounds regular II/VI SEM LSB abdomen obese soft nontender mild pitting edema on the flanks,  Trace lower extremity edema  Lab work reviewed showing creatinine 1.5 BUN 55 potassium 44 sodium 135 hematocrit 30.8  A/P: 1. Acute  on chronic diastolic CHF: Continued CHF  Education with him Would prescribe torsemide 40 at discharge He is missing doses of diuretic in the afternoon at home High fluid intake and poor diet  Continue Coreg and lisinopril --needs additional diuresis , high risk of readmission   2. Bioprosthetic AVR: -Followed by [facility] cardiology -Recent echo   3. Acute on CKD stage III:  Stable renal function,  will start on torsemide 40 mg daily  4. Morbid obesity/OSA/possible OHS: -CPAP -Recommend dietitian as an outpatient  5. IDDM: poorly controlled numbers  defer to the primary care physician  6. Anemia of chronic disease: hematocrit 31, decline in the past 2 years Outpatient work-up  Long discussion with him concerning CHF management Greater than 50% was spent in counseling and coordination of care with patient Total encounter time 35 minutes or more  Signed, Dossie Arbour, MD, Ph.D [facility]

## 2017-09-23 NOTE — Progress Notes (Signed)
Pt on [facility] C-3  CPAP with 2L O2 inline. CPAP plugged into red outlet

## 2017-09-24 DIAGNOSIS — Z952 Presence of prosthetic heart valve: Secondary | ICD-10-CM

## 2017-09-24 DIAGNOSIS — E118 Type 2 diabetes mellitus with unspecified complications: Secondary | ICD-10-CM

## 2017-09-24 LAB — CBC
HEMATOCRIT: 31.3 % — AB (ref 40.0–52.0)
Hemoglobin: 10.6 g/dL — ABNORMAL LOW (ref 13.0–18.0)
MCH: 30.6 pg (ref 26.0–34.0)
MCHC: 34 g/dL (ref 32.0–36.0)
MCV: 90.2 fL (ref 80.0–100.0)
PLATELETS: 203 10*3/uL (ref 150–440)
RBC: 3.47 MIL/uL — ABNORMAL LOW (ref 4.40–5.90)
RDW: 18.6 % — AB (ref 11.5–14.5)
WBC: 6.4 10*3/uL (ref 3.8–10.6)

## 2017-09-24 LAB — GLUCOSE, CAPILLARY
Glucose-Capillary: 253 mg/dL — ABNORMAL HIGH (ref 70–99)
Glucose-Capillary: 177 mg/dL — ABNORMAL HIGH (ref 70–99)
Glucose-Capillary: 247 mg/dL — ABNORMAL HIGH (ref 70–99)

## 2017-09-24 LAB — BASIC METABOLIC PANEL
Anion gap: 8 (ref 5–15)
BUN: 48 mg/dL — AB (ref 8–23)
CHLORIDE: 97 mmol/L — AB (ref 98–111)
CO2: 31 mmol/L (ref 22–32)
Calcium: 9 mg/dL (ref 8.9–10.3)
Creatinine, Ser: 1.52 mg/dL — ABNORMAL HIGH (ref 0.61–1.24)
GFR calc Af Amer: 51 mL/min — ABNORMAL LOW (ref >60)
GFR calc non Af Amer: 44 mL/min — ABNORMAL LOW (ref >60)
GLUCOSE: 225 mg/dL — AB (ref 70–99)
POTASSIUM: 4 mmol/L (ref 3.5–5.1)
SODIUM: 136 mmol/L (ref 135–145)

## 2017-09-24 MED ORDER — TORSEMIDE 20 MG PO TABS: 40.0000 mg | ORAL_TABLET | Freq: Every day | ORAL | 0 refills | Status: AC

## 2017-09-24 NOTE — Care Management Note (Signed)
Case Management Note  Patient Details  Name: Egbert GaribaldiJackie Leathers MRN: [id number] Date of Birth: 01/02/1946   Patient to discharge home today.  Home health orders are in place for RN and PT.  Elnita MaxwellCheryl with [facility] notified of discharge.   Subjective/Objective:                    Action/Plan:   Expected Discharge Date:  09/24/17               Expected Discharge Plan:  Home w Home Health Services  In-House Referral:     Discharge planning Services  CM Consult  Post Acute Care Choice:  Home Health Choice offered to:  Patient  DME Arranged:    DME Agency:     HH Arranged:  RN, PT HH Agency:  [facility]  Status of Service:  Completed, signed off  If discussed at Long Length of Stay Meetings, dates discussed:    Additional Comments:  Chapman FitchBOWEN, Chade Pitner T, RN 09/24/2017, 10:13 AM

## 2017-09-24 NOTE — Progress Notes (Addendum)
Inpatient Diabetes Program Recommendations  AACE/ADA: New Consensus Statement on Inpatient Glycemic Control (2019)  Target Ranges:  Prepandial:   less than 140 mg/dL      Peak postprandial:   less than 180 mg/dL (1-2 hours)      Critically ill patients:  140 - 180 mg/dL  Results for Taylor Stephenson, [name] (MRN [id number]) as of 09/24/2017 07:41  Ref. Range 09/23/2017 11:19 09/23/2017 16:19 09/23/2017 20:43 09/24/2017 03:37  Glucose-Capillary Latest Ref Range: 70 - 99 mg/dL 409256 (H)  Novolog 8 units 254 (H)  Novolog 8 units 271 (H)  Lantus 65 units 253 (H)   Results for Taylor Stephenson, [name] (MRN [id number]) as of 09/23/2017 09:20  Ref. Range 09/22/2017 08:01 09/22/2017 11:07 09/22/2017 16:51 09/22/2017 17:26 09/22/2017 17:49 09/22/2017 18:27 09/22/2017 21:28 09/23/2017 04:00 09/23/2017 07:54  Glucose-Capillary Latest Ref Range: 70 - 99 mg/dL 956262 (H)  Novolog 8 units 298 (H)  Novolog 8 units 74  Lantus 40 units @15 :24 57 (L) 52 (L) 109 (H) 134 (H) 177 (H) 182 (H)  Novolog 3 units   Results for Taylor Stephenson, [name] (MRN [id number]) as of 09/23/2017 09:20  Ref. Range 09/21/2017 07:41 09/21/2017 12:02 09/21/2017 17:14 09/21/2017 21:07  Glucose-Capillary Latest Ref Range: 70 - 99 mg/dL 469268 (H)  Novolog 8 units 313 (H)  Novolog 11 units 328 (H)  Novolog 11 units 333 (H)  Lantus 60 units@21 :42   Review of Glycemic Control  Diabetes history: DM2 Outpatient Diabetes medications: Lantus 40 units BID, Regular 20 units TID Current orders for Inpatient glycemic control: Lantus 65 units QHS, Novolog 0-15 units TID with meals  Inpatient Diabetes Program Recommendations: Insulin - Basal: Please consider increasing Lantus to 70 units QHS. Insulin-Correction: Please consider ordering Novolog 0-5 units QHS for bedtime correction. Insulin - Meal Coverage: Please consider ordering Novolog 5 units TID with meals for meal coverage if patient eats at least 50% of meals.  Thanks, Orlando PennerMarie Terral Cooks, RN, MSN, CDE Diabetes  Coordinator Inpatient Diabetes Program [phone] (Team Pager from 8am to 5pm)

## 2017-09-24 NOTE — Discharge Instructions (Signed)
It was a pleasure taking care of you!  You came into the hospital because you were having shortness of breath. You were treated for a CHF exacerbation, which is when you build up fluid in your body. We gave you medications through your IV to help you get rid of that fluid. We have started a new medication called Torsemide. You should take 2 tablets once daily. Please STOP the Lasix.  Please make sure you follow-up with your heart doctor next Thursday, July 27th.  If you have any worsening shortness of breath or new chest pain, please come back to the hospital.

## 2017-09-24 NOTE — Progress Notes (Signed)
Discharged to home with his wife.  Home care, PT, Aide, and RN, has been set up for him.  He has follow up with the CHF clinic and he will set up an appointment with the [facility] [place].

## 2017-09-24 NOTE — Care Management Important Message (Signed)
Copy of signed IM left with patient in room.  

## 2017-09-24 NOTE — Plan of Care (Signed)
Patient has received significant education on prevention and early intervention with his CHF - all focused on keeping him out of the hospital

## 2017-09-24 NOTE — Discharge Summary (Addendum)
[facility] [place] [facility]   PATIENT NAME: Taylor GaribaldiJackie Stephenson    MR#:  [id number]  DATE OF BIRTH:  Mar 30, 1945  DATE OF ADMISSION:  09/20/2017   ADMITTING PHYSICIAN: Arnaldo NatalMichael S Diamond, MD  DATE OF DISCHARGE: 7/197/19  PRIMARY CARE PHYSICIAN: [facility] [facility]   ADMISSION DIAGNOSIS:  Acute on chronic congestive heart failure, unspecified heart failure type (HCC) [I50.9] DISCHARGE DIAGNOSIS:  Active Problems:   Acute on chronic diastolic CHF (congestive heart failure) (HCC)  SECONDARY DIAGNOSIS:   Past Medical History:  Diagnosis Date  . Aortic valve disease    a. s/p bioprosthetic AVR in ~ 2014  . CHF (congestive heart failure) (HCC)   . CKD (chronic kidney disease), stage II   . Diabetes mellitus with complication (HCC)   . Hypertension   . Morbid obesity (HCC)   . OSA (obstructive sleep apnea)    a. on CPAP   HOSPITAL COURSE:  Annice PihJackie is a 72 year old male with a PMH of CHF, CKD, diabetes, HTN, and OSA presenting to the ED with shortness of breath and lower extremity edema. He had also noticed a 12lb weight gain at home. CXR showed pulmonary edema. He was admitted for CHF exacerbation.  1.  CHF-acute on chronic diastolic dysfunction. - initially diuresed with IV Lasix, then switched to Torsemide 40mg  daily per cards - coreg and lisinopril were continued - net -9L, 17lb weight loss this admission - HHPT and HHRN ordered on discharge  2.  CKD stage III - Cr peaked at 1.84 with IV diuresis, improved to 1.52 on the day of discharge (baseline Cr 1.3-1.5) - recheck bmp as outpatient  3.  Diabetes type 2 without complication - had some episodes of hypoglycemia to the 50s after receiving Lantus 60 units in the am and 40 units in the pm. Also with some hypoglycemia at home.  - discharged on Lantus 40 units bid and Novolin 20 units tid  4.  Obstructive sleep apnea - CPAP qhs  5. Essential HTN - well-controlled - home coreg and  lisinopril were continued  6. BPH - stable - flomax was continued  7. Depression - stable - home zoloft was continued   DISCHARGE CONDITIONS:  Acute on chronic diastolic heart failure CKD III Type 2 diabetes OSA HTN BPH Depression CONSULTS OBTAINED:  Treatment Team:  Yvonne KendallEnd, Christopher, MD DRUG ALLERGIES:   Allergies  Allergen Reactions  . Latex    DISCHARGE MEDICATIONS:   Allergies as of 09/24/2017      Reactions   Latex       Medication List    STOP taking these medications   furosemide 40 MG tablet Commonly known as:  LASIX     TAKE these medications   acetaminophen 500 MG tablet Commonly known as:  TYLENOL Take 500-1,000 mg by mouth every 6 (six) hours as needed.   acidophilus Caps capsule Take 1 capsule by mouth daily.   albuterol 108 (90 Base) MCG/ACT inhaler Commonly known as:  PROVENTIL HFA;VENTOLIN HFA Inhale 2 puffs into the lungs every 6 (six) hours as needed for wheezing or shortness of breath.   ammonium lactate 12 % lotion Commonly known as:  LAC-HYDRIN Apply 1 application topically as needed for dry skin.   aspirin EC 81 MG tablet Take 81 mg by mouth daily.   atorvastatin 40 MG tablet Commonly known as:  LIPITOR Take 40 mg by mouth at bedtime.   carvedilol 12.5 MG tablet Commonly known as:  COREG Take 12.5 mg by mouth 2 (two) times daily with a meal.   GLUCAGON EMERGENCY 1 MG injection Generic drug:  glucagon Inject 1 mg into the vein once as needed (emergency hypoglycemia).   insulin glargine 100 UNIT/ML injection Commonly known as:  LANTUS Inject 40 Units into the skin 2 (two) times daily.   insulin regular 100 units/mL injection Commonly known as:  NOVOLIN R,HUMULIN R Inject 20 Units into the skin 3 (three) times daily before meals. If blood sugar is over 125.   lisinopril 30 MG tablet Commonly known as:  PRINIVIL,ZESTRIL Take 1 tablet by mouth daily.   omeprazole 20 MG capsule Commonly known as:  PRILOSEC Take 20  mg by mouth daily.   potassium chloride SA 20 MEQ tablet Commonly known as:  K-DUR,KLOR-CON Take 1 tablet (20 mEq total) by mouth daily.   sertraline 50 MG tablet Commonly known as:  ZOLOFT Take 1 tablet by mouth daily.   tamsulosin 0.4 MG Caps capsule Commonly known as:  FLOMAX Take 0.4 mg by mouth daily.   Tiotropium Bromide-Olodaterol 2.5-2.5 MCG/ACT Aers Inhale 2 puffs into the lungs 2 (two) times daily.   torsemide 20 MG tablet Commonly known as:  DEMADEX Take 2 tablets (40 mg total) by mouth daily.        DISCHARGE INSTRUCTIONS:  1. F/u with PCP in 1-2 weeks 2. F/u with cardiology in 1 week 3. F/u fluid status- patient switched from lasix to torsemide 40mg  daily this admission 4. F/u blood sugars- patient had hypoglycemia to the 50s with Lantus 60 units in the am and 40 units in the pm. Discharged on home lantus 40 units bid. 5. HHPT ordered- please make sure patient is receiving these services. DIET:  Cardiac diet DISCHARGE CONDITION:  Stable ACTIVITY:  Activity as tolerated OXYGEN:  Home Oxygen: No.  Oxygen Delivery: room air DISCHARGE LOCATION:  home   If you experience worsening of your admission symptoms, develop shortness of breath, life threatening emergency, suicidal or homicidal thoughts you must seek medical attention immediately by calling 911 or calling your MD immediately  if symptoms less severe.  You Must read complete instructions/literature along with all the possible adverse reactions/side effects for all the Medicines you take and that have been prescribed to you. Take any new Medicines after you have completely understood and accpet all the possible adverse reactions/side effects.   Please note  You were cared for by a hospitalist during your hospital stay. If you have any questions about your discharge medications or the care you received while you were in the hospital after you are discharged, you can call the unit and asked to speak with  the hospitalist on call if the hospitalist that took care of you is not available. Once you are discharged, your primary care physician will handle any further medical issues. Please note that NO REFILLS for any discharge medications will be authorized once you are discharged, as it is imperative that you return to your primary care physician (or establish a relationship with a primary care physician if you do not have one) for your aftercare needs so that they can reassess your need for medications and monitor your lab values.    On the day of Discharge:  VITAL SIGNS:  Blood pressure (!) 138/53, pulse 67, temperature 98.8 F (37.1 C), temperature source Oral, resp. rate 18, height 5\' 7"  (1.702 m), weight 132.9 kg (293 lb), SpO2 97 %. PHYSICAL EXAMINATION:  GENERAL:  72 y.o.-year-old patient lying  in the bed with no acute distress.  EYES: Pupils equal, round, reactive to light and accommodation. No scleral icterus. Extraocular muscles intact.  HEENT: Head atraumatic, normocephalic. Oropharynx and nasopharynx clear.  NECK:  Supple, no jugular venous distention. No thyroid enlargement, no tenderness.  LUNGS: Normal breath sounds bilaterally, no wheezing, rales,rhonchi or crepitation. No use of accessory muscles of respiration.  CARDIOVASCULAR: S1, S2 normal. No murmurs, rubs, or gallops.  ABDOMEN: Soft, non-tender, non-distended. Bowel sounds present. No organomegaly or mass.  EXTREMITIES: No pedal edema, cyanosis, or clubbing.  NEUROLOGIC: Cranial nerves II through XII are intact. Muscle strength 5/5 in all extremities. Sensation intact. Gait not checked.  PSYCHIATRIC: The patient is alert and oriented x 3.  SKIN: No obvious rash, lesion, or ulcer.  DATA REVIEW:   CBC Recent Labs  Lab 09/24/17 0521  WBC 6.4  HGB 10.6*  HCT 31.3*  PLT 203    Chemistries  Recent Labs  Lab 09/24/17 0521  NA 136  K 4.0  CL 97*  CO2 31  GLUCOSE 225*  BUN 48*  CREATININE 1.52*  CALCIUM 9.0      Microbiology Results  No results found for this or any previous visit.  RADIOLOGY:  No results found.   Management plans discussed with the patient, family and they are in agreement.  CODE STATUS: Full Code   TOTAL TIME TAKING CARE OF THIS PATIENT: 30 minutes.    Jinny Blossom Neal Oshea M.D on 09/24/2017 at 8:24 AM  Between 7am to 6pm - Pager - [phone]  After 6pm go to [email or web address] - Social research officer, government  [facility] [place] [facility]  Office  [phone]  CC: Primary care physician; [facility]   Note: This dictation was prepared with Dragon dictation along with smaller phrase technology. Any transcriptional errors that result from this process are unintentional.

## 2017-09-24 NOTE — Progress Notes (Signed)
Progress Note  Patient Name: Taylor Stephenson Date of Encounter: 09/24/2017  Primary Cardiologist: [facility]  Subjective   Feels better, would like to go home Has follow up with CHF clinic, [facility] and [facility]ology Home PT, nursing  Inpatient Medications    Scheduled Meds: . acidophilus  1 capsule Oral Daily  . aspirin EC  81 mg Oral Daily  . atorvastatin  40 mg Oral QHS  . carvedilol  3.125 mg Oral BID WC  . docusate sodium  100 mg Oral BID  . fluticasone furoate-vilanterol  1 puff Inhalation Daily  . heparin  5,000 Units Subcutaneous Q8H  . insulin aspart  0-15 Units Subcutaneous TID WC  . insulin glargine  65 Units Subcutaneous QHS  . lisinopril  2.5 mg Oral Daily  . pantoprazole  40 mg Oral Daily  . potassium chloride SA  20 mEq Oral Daily  . protein supplement shake  11 oz Oral BID BM  . sertraline  50 mg Oral Daily  . sodium chloride flush  3 mL Intravenous Q12H  . tamsulosin  0.4 mg Oral Daily  . torsemide  40 mg Oral Daily   Continuous Infusions:  PRN Meds: acetaminophen **OR** acetaminophen, ammonium lactate, lactulose, ondansetron **OR** ondansetron (ZOFRAN) IV   Vital Signs    Vitals:   09/23/17 1916 09/23/17 2311 09/24/17 0337 09/24/17 0820  BP: 122/61  (!) 142/85 (!) 138/53  Pulse: 69  68 67  Resp: 18  18   Temp: 98 F (36.7 C)  98 F (36.7 C) 98.8 F (37.1 C)  TempSrc: Oral  Oral Oral  SpO2: 92% 93% 93% 97%  Weight:   293 lb (132.9 kg)   Height:        Intake/Output Summary (Last 24 hours) at 09/24/2017 0958 Last data filed at 09/24/2017 [id number] Gross per 24 hour  Intake 240 ml  Output 4200 ml  Net -3960 ml   Filed Weights   09/22/17 0404 09/23/17 0402 09/24/17 0337  Weight: (!) 302 lb 9.6 oz (137.3 kg) (!) 302 lb 12.8 oz (137.3 kg) 293 lb (132.9 kg)    Physical Exam   Constitutional:  oriented to person, place, and time. No distress. Obese HENT:  Head: Normocephalic and atraumatic.  Eyes:  no  discharge. No scleral icterus.  Neck: Normal range of motion. Neck supple. No JVD present.  Cardiovascular: Normal rate, regular rhythm, normal heart sounds and intact distal pulses. Exam reveals no gallop and no friction rub. No edema No murmur heard. Pulmonary/Chest: Effort normal and breath sounds normal. No stridor. No respiratory distress.  no wheezes.  no rales.  no tenderness.  Abdominal: Soft.  no distension.  no tenderness.  Musculoskeletal: Normal range of motion.  no  tenderness or deformity.  Neurological:  normal muscle tone. Coordination normal. No atrophy Skin: Skin is warm and dry. No rash noted. not diaphoretic.  Psychiatric:  normal mood and affect. behavior is normal. Thought content normal.   Labs    Chemistry Recent Labs  Lab 09/22/17 0548 09/23/17 0454 09/24/17 0521  NA 134* 135 136  K 4.4 4.4 4.0  CL 101 100 97*  CO2 26 25 31   GLUCOSE 317* 170* 225*  BUN 53* 55* 48*  CREATININE 1.84* 1.50* 1.52*  CALCIUM 8.5* 8.7* 9.0  GFRNONAA 35* 44* 44*  GFRAA 40* 52* 51*  ANIONGAP 7 10 8      Hematology Recent Labs  Lab 09/20/17 2051 09/23/17 0454 09/24/17 [id number]  WBC 8.0 6.7 6.4  RBC 3.41* 3.40* 3.47*  HGB 10.6* 10.4* 10.6*  HCT 31.2* 30.8* 31.3*  MCV 91.4 90.6 90.2  MCH 31.2 30.5 30.6  MCHC 34.2 33.7 34.0  RDW 18.3* 18.4* 18.6*  PLT 180 200 203    Cardiac Enzymes Recent Labs  Lab 09/20/17 2051  TROPONINI <0.03    BNP Recent Labs  Lab 09/20/17 2051  BNP 555.0*     Radiology    No results found.  Telemetry    RSR, 1st deg avb - Personally Reviewed  Cardiac Studies   2D Echocardiogram 07/15/2017  Study Conclusions   - Left ventricle: The cavity size was normal. There was moderate   concentric hypertrophy. Systolic function was normal. The   estimated ejection fraction was in the range of 55% to 60%. Wall   motion was normal; there were no regional wall motion   abnormalities. Features are consistent with a pseudonormal left    ventricular filling pattern, with concomitant abnormal relaxation   and increased filling pressure (grade 2 diastolic dysfunction). - Aortic valve: There was very mild stenosis. There was mild   regurgitation. - Mitral valve: Calcified annulus. Mildly thickened leaflets .   There was mild regurgitation. - Left atrium: The atrium was mildly dilated. - Right ventricle: The cavity size was moderately dilated. Wall   thickness was normal. Systolic function was moderately reduced. - Pulmonary arteries: Systolic pressure could not be accurately   estimated.   Patient Profile     72 y.o. 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  Assessment & Plan    1. Acute on chronic diastolic CHF: Would prescribe torsemide 40 daily at discharge He is missing doses of diuretic in the afternoon at home High fluid intake and poor diet , Continued CHF Continue Coreg and lisinopril -Stable for D/c with close follow up at the [facility] hospital  2. Bioprosthetic AVR: -Followed by [facility] cardiology -Recent echo   3. Acute on CKD stage III:  Stable renal function, CR 1.52, BUN 48 Continue torsemide 40 mg daily  4. Morbid obesity/OSA/possible OHS: -CPAP -Recommend dietitian , low carb  5. IDDM: poorly controlled numbers  [facility]  6. Anemia of chronic disease: hematocrit 31, decline in the past 2 years Outpatient work-up This will contribute to sx  Long discussion with him concerning CHF management Greater than 50% was spent in counseling and coordination of care with patient Total encounter time 25 minutes or more  Signed, Dossie Arbour, MD, Ph.D [facility]  [facility]Care

## 2017-09-27 ENCOUNTER — Telehealth: Payer: Self-pay

## 2017-09-27 NOTE — Telephone Encounter (Signed)
Flagged on EMMI report for not reading discharge papers and for not having a follow up scheduled.  First attempt to reach patient made 09/27/17 at 1:50pm, however unavailable.  Left message encouraging callback.  Will attempt at later time.

## 2017-09-28 NOTE — Telephone Encounter (Signed)
Reached patient upon second attempt on 09/28/17 at 1:35pm.  He reports he is doing feeling better and doing some things around the house.  He hasn't had a chance to read through his discharge papers completely, but has glanced over them.  He is aware of his follow up appointment with the CHF Clinic on 7/25 and has [facility] regarding setting up a follow up with his PCP, just awaiting callback with appointment date.  His only question was when he could drive again.  Upon reviewing chart and discharge papers, did not see any restrictions for driving so encouraged patient to take at his on pace.  No other questions or concerns at this time.  I thanked him for his time and made him aware that he would receive one more automated call checking on him in the next few days.

## 2017-09-29 ENCOUNTER — Telehealth: Payer: Self-pay

## 2017-09-29 NOTE — Telephone Encounter (Signed)
EMMI Follow-up: Had a message from Mr. Taylor Stephenson that he had received a call from my number.  I returned the call to Mr. Taylor Stephenson but had to leave a message for him to call me at his convenience if he had any questions or concerns.

## 2017-09-30 ENCOUNTER — Telehealth: Payer: Self-pay | Admitting: Family

## 2017-09-30 ENCOUNTER — Ambulatory Visit: Payer: Medicare Other | Admitting: Family

## 2017-09-30 NOTE — Telephone Encounter (Signed)
Patient did not show for his Heart Failure Clinic appointment on 09/30/17. Will attempt to reschedule.

## 2017-10-07 ENCOUNTER — Ambulatory Visit: Payer: Medicare Other | Attending: Family | Admitting: Family

## 2017-10-07 ENCOUNTER — Encounter: Payer: Self-pay | Admitting: Family

## 2017-10-07 VITALS — BP 114/37 | HR 79 | Resp 18 | Ht 67.0 in | Wt 279.4 lb

## 2017-10-07 DIAGNOSIS — R0602 Shortness of breath: Secondary | ICD-10-CM | POA: Diagnosis not present

## 2017-10-07 DIAGNOSIS — I5032 Chronic diastolic (congestive) heart failure: Secondary | ICD-10-CM | POA: Insufficient documentation

## 2017-10-07 DIAGNOSIS — N183 Chronic kidney disease, stage 3 unspecified: Secondary | ICD-10-CM

## 2017-10-07 DIAGNOSIS — E1122 Type 2 diabetes mellitus with diabetic chronic kidney disease: Secondary | ICD-10-CM | POA: Insufficient documentation

## 2017-10-07 DIAGNOSIS — Z79899 Other long term (current) drug therapy: Secondary | ICD-10-CM | POA: Insufficient documentation

## 2017-10-07 DIAGNOSIS — Z87891 Personal history of nicotine dependence: Secondary | ICD-10-CM | POA: Diagnosis not present

## 2017-10-07 DIAGNOSIS — Z833 Family history of diabetes mellitus: Secondary | ICD-10-CM | POA: Diagnosis not present

## 2017-10-07 DIAGNOSIS — Z9889 Other specified postprocedural states: Secondary | ICD-10-CM | POA: Insufficient documentation

## 2017-10-07 DIAGNOSIS — G8929 Other chronic pain: Secondary | ICD-10-CM | POA: Insufficient documentation

## 2017-10-07 DIAGNOSIS — I130 Hypertensive heart and chronic kidney disease with heart failure and stage 1 through stage 4 chronic kidney disease, or unspecified chronic kidney disease: Secondary | ICD-10-CM | POA: Insufficient documentation

## 2017-10-07 DIAGNOSIS — R42 Dizziness and giddiness: Secondary | ICD-10-CM | POA: Diagnosis not present

## 2017-10-07 DIAGNOSIS — Z794 Long term (current) use of insulin: Secondary | ICD-10-CM | POA: Diagnosis not present

## 2017-10-07 DIAGNOSIS — Z7982 Long term (current) use of aspirin: Secondary | ICD-10-CM | POA: Diagnosis not present

## 2017-10-07 DIAGNOSIS — G4733 Obstructive sleep apnea (adult) (pediatric): Secondary | ICD-10-CM | POA: Diagnosis not present

## 2017-10-07 DIAGNOSIS — Z841 Family history of disorders of kidney and ureter: Secondary | ICD-10-CM | POA: Diagnosis not present

## 2017-10-07 DIAGNOSIS — Z953 Presence of xenogenic heart valve: Secondary | ICD-10-CM | POA: Diagnosis not present

## 2017-10-07 DIAGNOSIS — I10 Essential (primary) hypertension: Secondary | ICD-10-CM

## 2017-10-07 DIAGNOSIS — Z91040 Latex allergy status: Secondary | ICD-10-CM | POA: Diagnosis not present

## 2017-10-07 DIAGNOSIS — N182 Chronic kidney disease, stage 2 (mild): Secondary | ICD-10-CM | POA: Diagnosis not present

## 2017-10-07 NOTE — Patient Instructions (Signed)
Continue weighing daily and call for an overnight weight gain of > 2 pounds or a weekly weight gain of >5 pounds. 

## 2017-10-07 NOTE — Progress Notes (Signed)
Patient ID: Taylor Stephenson, male    DOB: [birth year], 72 y.o.   MRN: [id number]  HPI  Taylor Stephenson is a 72 y/o male with a history of vertigo, DM, HTN, CKD, obstructive sleep apnes (CPAP), aortic valve disease, previous tobacco use and chronic heart failure.   Echo report from 07/14/17 reviewed and showed an EF of 55-60% along with mild AS.   Admitted 09/20/17 due to acute on chronic HF. Cardiology consult obtained. Initially needed IV lasix and then transitioned to oral diuretics. Net loss of ~ 9L. Discharged after 4 days. Admitted 07/14/17 due to acute on chronic HF due to too much fluid intake. Cardiology consult obtained. Initially needed IV lasix and then transitioned to oral diuretics with a net loss of 20L. Discharged after 4 days.  He presents today for a follow-up visit with a chief complaint of moderate fatigue upon minimal exertion. He describes this as chronic in nature having been present for several years. He has associated shortness of breath, easy bruising and chronic pain along with this. He denies any difficulty sleeping, abdominal distention, palpitations, chest pain, dizziness or weight gain.   Past Medical History:  Diagnosis Date  . Aortic valve disease    a. s/p bioprosthetic AVR in ~ 2014  . CHF (congestive heart failure) (HCC)   . CKD (chronic kidney disease), stage II   . Diabetes mellitus with complication (HCC)   . Hypertension   . Morbid obesity (HCC)   . OSA (obstructive sleep apnea)    a. on CPAP   Past Surgical History:  Procedure Laterality Date  . AORTA SURGERY    . APPENDECTOMY    . CARDIAC SURGERY     heart valve replaced   Family History  Problem Relation Age of Onset  . Diabetes Mother   . Diabetes Father   . Kidney disease Father   . Diabetes Sister    Social History   Tobacco Use  . Smoking status: Former Smoker    Types: Cigarettes  . Smokeless tobacco: Never Used  Substance Use Topics  . Alcohol use: Not on file   Allergies   Allergen Reactions  . Latex    Prior to Admission medications   Medication Sig Start Date End Date Taking? Authorizing Provider  acetaminophen (TYLENOL) 500 MG tablet Take 500-1,000 mg by mouth every 6 (six) hours as needed.   Yes [provider]  acidophilus (RISAQUAD) CAPS capsule Take 1 capsule by mouth daily.    Yes [provider]  albuterol (PROVENTIL HFA;VENTOLIN HFA) 108 (90 Base) MCG/ACT inhaler Inhale 2 puffs into the lungs every 6 (six) hours as needed for wheezing or shortness of breath.   Yes [provider]  ammonium lactate (LAC-HYDRIN) 12 % lotion Apply 1 application topically as needed for dry skin.   Yes [provider]  aspirin EC 81 MG tablet Take 81 mg by mouth daily.   Yes [provider]  atorvastatin (LIPITOR) 40 MG tablet Take 40 mg by mouth at bedtime.   Yes [provider]  carvedilol (COREG) 12.5 MG tablet Take 12.5 mg by mouth 2 (two) times daily with a meal.   Yes [provider]  glucagon (GLUCAGON EMERGENCY) 1 MG injection Inject 1 mg into the vein once as needed (emergency hypoglycemia).   Yes [provider]  insulin glargine (LANTUS) 100 UNIT/ML injection Inject 40 Units into the skin 2 (two) times daily.    Yes [provider]  insulin regular (NOVOLIN  R,HUMULIN R) 100 units/mL injection Inject 20 Units into the skin 3 (three) times daily before meals. If blood sugar is over 125.   Yes [provider]  lisinopril (PRINIVIL,ZESTRIL) 30 MG tablet Take 1 tablet by mouth daily.   Yes [provider]  omeprazole (PRILOSEC) 20 MG capsule Take 20 mg by mouth daily.   Yes [provider]  potassium chloride SA (K-DUR,KLOR-CON) 20 MEQ tablet Take 1 tablet (20 mEq total) by mouth daily. 07/19/17  Yes Enedina Finner, MD  sertraline (ZOLOFT) 50 MG tablet Take 1 tablet by mouth daily.    Yes [provider]  tamsulosin (FLOMAX) 0.4 MG CAPS capsule Take 0.4 mg by  mouth daily.   Yes [provider]  Tiotropium Bromide-Olodaterol 2.5-2.5 MCG/ACT AERS Inhale 2 puffs into the lungs 2 (two) times daily.    Yes [provider]  torsemide (DEMADEX) 20 MG tablet Take 2 tablets (40 mg total) by mouth daily. 09/24/17  Yes Mayo, Allyn Kenner, MD   Review of Systems  Constitutional: Positive for fatigue (with little exertion). Negative for appetite change.  HENT: Positive for hearing loss. Negative for congestion and postnasal drip.   Eyes: Negative.   Respiratory: Positive for shortness of breath (minimal). Negative for chest tightness and wheezing.   Cardiovascular: Negative for chest pain, palpitations and leg swelling.  Gastrointestinal: Negative for abdominal distention and abdominal pain.  Endocrine: Negative.   Genitourinary: Negative.   Musculoskeletal: Positive for arthralgias (knees/ hips hurt at times) and back pain (on occasion).  Skin: Positive for wound (right forearm).  Allergic/Immunologic: Negative.   Neurological: Negative for dizziness and light-headedness.  Hematological: Negative for adenopathy. Bruises/bleeds easily.  Psychiatric/Behavioral: Negative for dysphoric mood and sleep disturbance (wearing CPAP nightly). The patient is not nervous/anxious.    Vitals:   10/07/17 1119  BP: (!) 114/37  Pulse: 79  Resp: 18  SpO2: 95%  Weight: 279 lb 6 oz (126.7 kg)  Height: 5\' 7"  (1.702 m)   Wt Readings from Last 3 Encounters:  10/07/17 279 lb 6 oz (126.7 kg)  09/24/17 293 lb (132.9 kg)  09/08/17 (!) 309 lb (140.2 kg)   Lab Results  Component Value Date   CREATININE 1.52 (H) 09/24/2017   CREATININE 1.50 (H) 09/23/2017   CREATININE 1.84 (H) 09/22/2017   Physical Exam  Constitutional: He is oriented to person, place, and time. He appears well-developed and well-nourished.  HENT:  Head: Normocephalic and atraumatic.  Neck: Normal range of motion. Neck supple. No JVD present.  Cardiovascular: Normal rate and regular  rhythm.  Pulmonary/Chest: Effort normal. No respiratory distress. He has no wheezes. He has no rales.  Abdominal: Soft. He exhibits no distension.  Musculoskeletal:       Right lower leg: He exhibits no tenderness and no edema.       Left lower leg: He exhibits no tenderness and no edema.  Neurological: He is alert and oriented to person, place, and time.  Skin: Skin is warm and dry.  Psychiatric: He has a normal mood and affect. His behavior is normal.  Nursing note and vitals reviewed.  Assessment & Plan:  1: Chronic heart failure with preserved ejection fraction- - NYHA class III - euvolemic today - weighing daily and says that his weight has stabilized since being out of the hospital. Reminded to call for an overnight weight gain of >2 pounds or a weekly weight gain of >5 pounds - weight down 29.4 pounds since he was last here a  month ago - not adding salt and he has been reading food labels. Has stopped eating out at many places (chinese, [employer]) because of the sodium content of those foods. Reviewed the importance of closely following a 2000mg  sodium diet  - trying to keep daily fluid intake to 60-64 ounces daily - [facility] - BNP 09/20/17 was 555.0  2: HTN- - BP looks good today - [facility] - BMP 09/24/17 reviewed and showed sodium 136, potassium 4.0 and GFR 44  3: DM-  - fasting glucose at home this morning was 236 - A1c on 07/14/17 was 7.5%  Medication list was reviewed.  Return in 4 months or sooner for any questions/ problems before then.

## 2017-11-09 ENCOUNTER — Inpatient Hospital Stay: Payer: Medicare Other

## 2017-11-09 ENCOUNTER — Emergency Department: Payer: Medicare Other

## 2017-11-09 ENCOUNTER — Other Ambulatory Visit: Payer: Medicare Other

## 2017-11-09 ENCOUNTER — Inpatient Hospital Stay (HOSPITAL_COMMUNITY)
Admit: 2017-11-09 | Discharge: 2017-11-09 | Disposition: A | Discharge status: Home / Self Care | Payer: Medicare Other | Attending: Physician Assistant | Admitting: Physician Assistant

## 2017-11-09 DIAGNOSIS — I480 Paroxysmal atrial fibrillation: Secondary | ICD-10-CM | POA: Diagnosis present

## 2017-11-09 DIAGNOSIS — I130 Hypertensive heart and chronic kidney disease with heart failure and stage 1 through stage 4 chronic kidney disease, or unspecified chronic kidney disease: Secondary | ICD-10-CM | POA: Diagnosis present

## 2017-11-09 DIAGNOSIS — E1122 Type 2 diabetes mellitus with diabetic chronic kidney disease: Secondary | ICD-10-CM | POA: Diagnosis present

## 2017-11-09 DIAGNOSIS — I214 Non-ST elevation (NSTEMI) myocardial infarction: Principal | ICD-10-CM

## 2017-11-09 DIAGNOSIS — D696 Thrombocytopenia, unspecified: Secondary | ICD-10-CM | POA: Diagnosis present

## 2017-11-09 DIAGNOSIS — D631 Anemia in chronic kidney disease: Secondary | ICD-10-CM | POA: Diagnosis present

## 2017-11-09 DIAGNOSIS — R945 Abnormal results of liver function studies: Secondary | ICD-10-CM

## 2017-11-09 DIAGNOSIS — Z794 Long term (current) use of insulin: Secondary | ICD-10-CM

## 2017-11-09 DIAGNOSIS — I4510 Unspecified right bundle-branch block: Secondary | ICD-10-CM | POA: Diagnosis present

## 2017-11-09 DIAGNOSIS — J9600 Acute respiratory failure, unspecified whether with hypoxia or hypercapnia: Secondary | ICD-10-CM | POA: Diagnosis present

## 2017-11-09 DIAGNOSIS — J449 Chronic obstructive pulmonary disease, unspecified: Secondary | ICD-10-CM | POA: Diagnosis present

## 2017-11-09 DIAGNOSIS — I469 Cardiac arrest, cause unspecified: Secondary | ICD-10-CM

## 2017-11-09 DIAGNOSIS — Z833 Family history of diabetes mellitus: Secondary | ICD-10-CM

## 2017-11-09 DIAGNOSIS — E1165 Type 2 diabetes mellitus with hyperglycemia: Secondary | ICD-10-CM | POA: Diagnosis present

## 2017-11-09 DIAGNOSIS — Z66 Do not resuscitate: Secondary | ICD-10-CM | POA: Diagnosis not present

## 2017-11-09 DIAGNOSIS — Z6841 Body Mass Index (BMI) 40.0 and over, adult: Secondary | ICD-10-CM

## 2017-11-09 DIAGNOSIS — E11649 Type 2 diabetes mellitus with hypoglycemia without coma: Secondary | ICD-10-CM | POA: Diagnosis present

## 2017-11-09 DIAGNOSIS — I517 Cardiomegaly: Secondary | ICD-10-CM

## 2017-11-09 DIAGNOSIS — I2510 Atherosclerotic heart disease of native coronary artery without angina pectoris: Secondary | ICD-10-CM | POA: Diagnosis present

## 2017-11-09 DIAGNOSIS — R570 Cardiogenic shock: Secondary | ICD-10-CM

## 2017-11-09 DIAGNOSIS — Z7982 Long term (current) use of aspirin: Secondary | ICD-10-CM

## 2017-11-09 DIAGNOSIS — J9811 Atelectasis: Secondary | ICD-10-CM | POA: Diagnosis present

## 2017-11-09 DIAGNOSIS — R7989 Other specified abnormal findings of blood chemistry: Secondary | ICD-10-CM

## 2017-11-09 DIAGNOSIS — B179 Acute viral hepatitis, unspecified: Secondary | ICD-10-CM | POA: Diagnosis present

## 2017-11-09 DIAGNOSIS — N183 Chronic kidney disease, stage 3 (moderate): Secondary | ICD-10-CM

## 2017-11-09 DIAGNOSIS — E875 Hyperkalemia: Secondary | ICD-10-CM

## 2017-11-09 DIAGNOSIS — G4733 Obstructive sleep apnea (adult) (pediatric): Secondary | ICD-10-CM | POA: Diagnosis present

## 2017-11-09 DIAGNOSIS — R569 Unspecified convulsions: Secondary | ICD-10-CM | POA: Diagnosis present

## 2017-11-09 DIAGNOSIS — I5043 Acute on chronic combined systolic (congestive) and diastolic (congestive) heart failure: Secondary | ICD-10-CM | POA: Diagnosis present

## 2017-11-09 DIAGNOSIS — E872 Acidosis: Secondary | ICD-10-CM | POA: Diagnosis present

## 2017-11-09 DIAGNOSIS — N182 Chronic kidney disease, stage 2 (mild): Secondary | ICD-10-CM | POA: Diagnosis present

## 2017-11-09 DIAGNOSIS — Z841 Family history of disorders of kidney and ureter: Secondary | ICD-10-CM

## 2017-11-09 DIAGNOSIS — G931 Anoxic brain damage, not elsewhere classified: Secondary | ICD-10-CM | POA: Diagnosis present

## 2017-11-09 DIAGNOSIS — Z515 Encounter for palliative care: Secondary | ICD-10-CM | POA: Diagnosis not present

## 2017-11-09 DIAGNOSIS — N170 Acute kidney failure with tubular necrosis: Secondary | ICD-10-CM | POA: Diagnosis present

## 2017-11-09 DIAGNOSIS — Z87891 Personal history of nicotine dependence: Secondary | ICD-10-CM

## 2017-11-09 DIAGNOSIS — J690 Pneumonitis due to inhalation of food and vomit: Secondary | ICD-10-CM | POA: Diagnosis present

## 2017-11-09 DIAGNOSIS — L8990 Pressure ulcer of unspecified site, unspecified stage: Secondary | ICD-10-CM

## 2017-11-09 DIAGNOSIS — Z953 Presence of xenogenic heart valve: Secondary | ICD-10-CM

## 2017-11-09 DIAGNOSIS — Z79899 Other long term (current) drug therapy: Secondary | ICD-10-CM

## 2017-11-09 DIAGNOSIS — K7200 Acute and subacute hepatic failure without coma: Secondary | ICD-10-CM | POA: Diagnosis present

## 2017-11-09 DIAGNOSIS — Z4659 Encounter for fitting and adjustment of other gastrointestinal appliance and device: Secondary | ICD-10-CM

## 2017-11-09 LAB — BASIC METABOLIC PANEL
Anion gap: 16 — ABNORMAL HIGH (ref 5–15)
Anion gap: 12 (ref 5–15)
Anion gap: 8 (ref 5–15)
BUN: 69 mg/dL — ABNORMAL HIGH (ref 8–23)
BUN: 75 mg/dL — ABNORMAL HIGH (ref 8–23)
BUN: 74 mg/dL — AB (ref 8–23)
CO2: 19 mmol/L — ABNORMAL LOW (ref 22–32)
CO2: 20 mmol/L — ABNORMAL LOW (ref 22–32)
CO2: 22 mmol/L (ref 22–32)
CREATININE: 2.68 mg/dL — AB (ref 0.61–1.24)
CREATININE: 2.69 mg/dL — AB (ref 0.61–1.24)
CREATININE: 2.99 mg/dL — AB (ref 0.61–1.24)
Calcium: 8.2 mg/dL — ABNORMAL LOW (ref 8.9–10.3)
Calcium: 9.1 mg/dL (ref 8.9–10.3)
Calcium: 8 mg/dL — ABNORMAL LOW (ref 8.9–10.3)
Chloride: 101 mmol/L (ref 98–111)
Chloride: 101 mmol/L (ref 98–111)
Chloride: 108 mmol/L (ref 98–111)
GFR calc Af Amer: 25 mL/min — ABNORMAL LOW (ref >60)
GFR calc Af Amer: 22 mL/min — ABNORMAL LOW (ref >60)
GFR calc non Af Amer: 22 mL/min — ABNORMAL LOW (ref >60)
GFR calc non Af Amer: 19 mL/min — ABNORMAL LOW (ref >60)
GFR, EST AFRICAN AMERICAN: 26 mL/min — AB (ref >60)
GFR, EST NON AFRICAN AMERICAN: 22 mL/min — AB (ref >60)
GLUCOSE: 313 mg/dL — AB (ref 70–99)
Glucose, Bld: 335 mg/dL — ABNORMAL HIGH (ref 70–99)
Glucose, Bld: 117 mg/dL — ABNORMAL HIGH (ref 70–99)
Potassium: 6.7 mmol/L (ref 3.5–5.1)
Potassium: 5.9 mmol/L — ABNORMAL HIGH (ref 3.5–5.1)
Potassium: 4.5 mmol/L (ref 3.5–5.1)
SODIUM: 138 mmol/L (ref 135–145)
Sodium: 136 mmol/L (ref 135–145)
Sodium: 133 mmol/L — ABNORMAL LOW (ref 135–145)

## 2017-11-09 LAB — COMPREHENSIVE METABOLIC PANEL
ALBUMIN: 2.9 g/dL — AB (ref 3.5–5.0)
ALT: 48 U/L — ABNORMAL HIGH (ref 0–44)
AST: 165 U/L — AB (ref 15–41)
Alkaline Phosphatase: 190 U/L — ABNORMAL HIGH (ref 38–126)
Anion gap: 13 (ref 5–15)
BUN: 75 mg/dL — AB (ref 8–23)
CHLORIDE: 103 mmol/L (ref 98–111)
CO2: 20 mmol/L — ABNORMAL LOW (ref 22–32)
Calcium: 9.3 mg/dL (ref 8.9–10.3)
Creatinine, Ser: 2.8 mg/dL — ABNORMAL HIGH (ref 0.61–1.24)
GFR calc Af Amer: 24 mL/min — ABNORMAL LOW (ref >60)
GFR, EST NON AFRICAN AMERICAN: 21 mL/min — AB (ref >60)
GLUCOSE: 315 mg/dL — AB (ref 70–99)
Potassium: 6 mmol/L — ABNORMAL HIGH (ref 3.5–5.1)
Sodium: 136 mmol/L (ref 135–145)
Total Bilirubin: 1.7 mg/dL — ABNORMAL HIGH (ref 0.3–1.2)
Total Protein: 8.3 g/dL — ABNORMAL HIGH (ref 6.5–8.1)

## 2017-11-09 LAB — MAGNESIUM: Magnesium: 2.5 mg/dL — ABNORMAL HIGH (ref 1.7–2.4)

## 2017-11-09 LAB — LACTIC ACID, PLASMA
LACTIC ACID, VENOUS: 5.3 mmol/L — AB (ref 0.5–1.9)
LACTIC ACID, VENOUS: 5.5 mmol/L — AB (ref 0.5–1.9)

## 2017-11-09 LAB — MRSA PCR SCREENING: MRSA by PCR: NEGATIVE

## 2017-11-09 LAB — CBC
HCT: 32.4 % — ABNORMAL LOW (ref 40.0–52.0)
HEMOGLOBIN: 10.3 g/dL — AB (ref 13.0–18.0)
MCH: 29.6 pg (ref 26.0–34.0)
MCHC: 31.8 g/dL — AB (ref 32.0–36.0)
MCV: 93.2 fL (ref 80.0–100.0)
Platelets: 129 10*3/uL — ABNORMAL LOW (ref 150–440)
RBC: 3.48 MIL/uL — AB (ref 4.40–5.90)
RDW: 19.9 % — ABNORMAL HIGH (ref 11.5–14.5)
WBC: 13.1 10*3/uL — ABNORMAL HIGH (ref 3.8–10.6)

## 2017-11-09 LAB — TROPONIN I
Troponin I: 0.07 ng/mL (ref <0.03)
Troponin I: 4.53 ng/mL (ref <0.03)
Troponin I: 10.65 ng/mL (ref <0.03)

## 2017-11-09 LAB — PROTIME-INR
INR: 1.45
Prothrombin Time: 17.5 seconds — ABNORMAL HIGH (ref 11.4–15.2)

## 2017-11-09 LAB — TYPE AND SCREEN
ABO/RH(D): A NEG
Antibody Screen: NEGATIVE

## 2017-11-09 LAB — GLUCOSE, CAPILLARY
GLUCOSE-CAPILLARY: 129 mg/dL — AB (ref 70–99)
GLUCOSE-CAPILLARY: 119 mg/dL — AB (ref 70–99)
GLUCOSE-CAPILLARY: 105 mg/dL — AB (ref 70–99)
GLUCOSE-CAPILLARY: 113 mg/dL — AB (ref 70–99)
GLUCOSE-CAPILLARY: 94 mg/dL (ref 70–99)
Glucose-Capillary: 284 mg/dL — ABNORMAL HIGH (ref 70–99)
Glucose-Capillary: 277 mg/dL — ABNORMAL HIGH (ref 70–99)
Glucose-Capillary: 309 mg/dL — ABNORMAL HIGH (ref 70–99)
Glucose-Capillary: 149 mg/dL — ABNORMAL HIGH (ref 70–99)
Glucose-Capillary: 88 mg/dL (ref 70–99)
Glucose-Capillary: 86 mg/dL (ref 70–99)

## 2017-11-09 LAB — APTT: aPTT: 67 seconds — ABNORMAL HIGH (ref 24–36)

## 2017-11-09 LAB — PHOSPHORUS: Phosphorus: 9.2 mg/dL — ABNORMAL HIGH (ref 2.5–4.6)

## 2017-11-09 LAB — PROCALCITONIN: Procalcitonin: 5.15 ng/mL

## 2017-11-09 LAB — CK: CK TOTAL: 355 U/L (ref 49–397)

## 2017-11-09 LAB — ECHOCARDIOGRAM COMPLETE: Weight: 4493.86 oz

## 2017-11-09 MED ORDER — SODIUM CHLORIDE 0.9 % IV SOLN
0.5000 mg/h | INTRAVENOUS | Status: DC
  Administered 2017-11-09: 1 mg/h via INTRAVENOUS
  Filled 2017-11-09: qty 10

## 2017-11-09 MED ORDER — SODIUM CHLORIDE 0.9 % IV BOLUS: 1000.0000 mL | Freq: Once | INTRAVENOUS | Status: DC

## 2017-11-09 MED ORDER — HEPARIN SODIUM (PORCINE) 5000 UNIT/ML IJ SOLN: 5000.0000 [IU] | Freq: Three times a day (TID) | INTRAMUSCULAR | Status: DC

## 2017-11-09 MED ORDER — INSULIN ASPART 100 UNIT/ML ~~LOC~~ SOLN
10.0000 [IU] | Freq: Once | SUBCUTANEOUS | Status: AC
  Administered 2017-11-09: 10 [IU] via INTRAVENOUS

## 2017-11-09 MED ORDER — MORPHINE SULFATE (PF) 4 MG/ML IV SOLN
4.0000 mg | Freq: Once | INTRAVENOUS | Status: AC
  Administered 2017-11-09: 4 mg via INTRAVENOUS
  Filled 2017-11-09: qty 1

## 2017-11-09 MED ORDER — SODIUM CHLORIDE 0.9% FLUSH
10.0000 mL | Freq: Two times a day (BID) | INTRAVENOUS | Status: DC
  Administered 2017-11-09 (×2): 10 mL

## 2017-11-09 MED ORDER — NOREPINEPHRINE 16 MG/250ML-% IV SOLN
0.0000 ug/min | INTRAVENOUS | Status: DC
  Administered 2017-11-09: 30 ug/min via INTRAVENOUS
  Administered 2017-11-09: 40 ug/min via INTRAVENOUS
  Filled 2017-11-09 (×5): qty 250

## 2017-11-09 MED ORDER — FENTANYL BOLUS VIA INFUSION
25.0000 ug | INTRAVENOUS | Status: DC | PRN
  Filled 2017-11-09: qty 25

## 2017-11-09 MED ORDER — SODIUM CHLORIDE 0.9% FLUSH: 10.0000 mL | INTRAVENOUS | Status: DC | PRN

## 2017-11-09 MED ORDER — FENTANYL 2500MCG IN NS 250ML (10MCG/ML) PREMIX INFUSION
25.0000 ug/h | INTRAVENOUS | Status: DC
  Administered 2017-11-09: 100 ug/h via INTRAVENOUS
  Filled 2017-11-09: qty 250

## 2017-11-09 MED ORDER — EPINEPHRINE PF 1 MG/ML IJ SOLN
0.5000 ug/min | INTRAVENOUS | Status: DC
  Administered 2017-11-09 (×2): 20 ug/min via INTRAVENOUS
  Administered 2017-11-09: 4 ug/min via INTRAVENOUS
  Filled 2017-11-09 (×4): qty 4

## 2017-11-09 MED ORDER — DOCUSATE SODIUM 100 MG PO CAPS: 100.0000 mg | ORAL_CAPSULE | Freq: Two times a day (BID) | ORAL | Status: DC | PRN

## 2017-11-09 MED ORDER — SODIUM CHLORIDE 0.9 % IV BOLUS
1000.0000 mL | Freq: Once | INTRAVENOUS | Status: AC
  Administered 2017-11-09: 1000 mL via INTRAVENOUS

## 2017-11-09 MED ORDER — LORAZEPAM 2 MG/ML IJ SOLN: 1.0000 mg | INTRAMUSCULAR | Status: DC | PRN

## 2017-11-09 MED ORDER — FENTANYL CITRATE (PF) 100 MCG/2ML IJ SOLN
50.0000 ug | Freq: Once | INTRAMUSCULAR | Status: AC
  Administered 2017-11-09: 50 ug via INTRAVENOUS

## 2017-11-09 MED ORDER — PIPERACILLIN-TAZOBACTAM 3.375 G IVPB
3.3750 g | Freq: Three times a day (TID) | INTRAVENOUS | Status: DC
  Filled 2017-11-09: qty 50

## 2017-11-09 MED ORDER — NOREPINEPHRINE 4 MG/250ML-% IV SOLN: 0.0000 ug/min | INTRAVENOUS | Status: DC

## 2017-11-09 MED ORDER — SODIUM CHLORIDE 0.9 % IV SOLN
INTRAVENOUS | Status: DC
  Administered 2017-11-09: 0.9 [IU]/h via INTRAVENOUS
  Filled 2017-11-09: qty 1

## 2017-11-09 MED ORDER — SODIUM CHLORIDE 0.9 % IV SOLN
500.0000 mg | Freq: Two times a day (BID) | INTRAVENOUS | Status: DC
  Filled 2017-11-09 (×2): qty 5

## 2017-11-09 MED ORDER — DOPAMINE-DEXTROSE 3.2-5 MG/ML-% IV SOLN
INTRAVENOUS | Status: AC | PRN
  Administered 2017-11-09: 10 ug/kg/min via INTRAVENOUS

## 2017-11-09 MED ORDER — DOPAMINE-DEXTROSE 3.2-5 MG/ML-% IV SOLN
INTRAVENOUS | Status: AC
  Filled 2017-11-09: qty 250

## 2017-11-09 MED ORDER — VASOPRESSIN 20 UNIT/ML IV SOLN
0.0300 [IU]/min | INTRAVENOUS | Status: DC
  Filled 2017-11-09 (×2): qty 2

## 2017-11-09 MED ORDER — LEVETIRACETAM IN NACL 500 MG/100ML IV SOLN
500.0000 mg | Freq: Two times a day (BID) | INTRAVENOUS | Status: DC
  Administered 2017-11-09: 500 mg via INTRAVENOUS
  Filled 2017-11-09 (×3): qty 100

## 2017-11-09 MED ORDER — CALCIUM CHLORIDE 10 % IV SOLN
INTRAVENOUS | Status: AC | PRN
  Administered 2017-11-09: 1 g via INTRAVENOUS

## 2017-11-09 MED ORDER — STERILE WATER FOR INJECTION IV SOLN
INTRAVENOUS | Status: DC
  Administered 2017-11-09: 21:00:00 via INTRAVENOUS
  Filled 2017-11-09 (×4): qty 850

## 2017-11-09 MED ORDER — DEXTROSE 50 % IV SOLN
INTRAVENOUS | Status: DC | PRN
  Administered 2017-11-09: 12.5 g via INTRAVENOUS

## 2017-11-09 MED ORDER — FAMOTIDINE IN NACL 20-0.9 MG/50ML-% IV SOLN: 20.0000 mg | Freq: Every day | INTRAVENOUS | Status: DC

## 2017-11-09 MED ORDER — DOPAMINE-DEXTROSE 3.2-5 MG/ML-% IV SOLN
INTRAVENOUS | Status: AC
  Administered 2017-11-09: 20 ug/kg/min via INTRAVENOUS
  Filled 2017-11-09: qty 250

## 2017-11-09 MED ORDER — CHLORHEXIDINE GLUCONATE 0.12% ORAL RINSE (MEDLINE KIT)
15.0000 mL | Freq: Two times a day (BID) | OROMUCOSAL | Status: DC
  Administered 2017-11-09: 15 mL via OROMUCOSAL

## 2017-11-09 MED ORDER — ORAL CARE MOUTH RINSE
15.0000 mL | OROMUCOSAL | Status: DC
  Administered 2017-11-09 (×2): 15 mL via OROMUCOSAL

## 2017-11-09 MED ORDER — PROPOFOL 1000 MG/100ML IV EMUL
INTRAVENOUS | Status: AC
  Filled 2017-11-09: qty 100

## 2017-11-09 MED ORDER — SODIUM CHLORIDE 0.9 % IV BOLUS
1000.0000 mL | INTRAVENOUS | Status: DC
  Administered 2017-11-09: 1000 mL via INTRAVENOUS

## 2017-11-09 MED ORDER — DOPAMINE-DEXTROSE 3.2-5 MG/ML-% IV SOLN
0.0000 ug/kg/min | INTRAVENOUS | Status: DC
  Administered 2017-11-09 (×3): 20 ug/kg/min via INTRAVENOUS
  Filled 2017-11-09 (×3): qty 250

## 2017-11-09 MED ORDER — INSULIN ASPART 100 UNIT/ML ~~LOC~~ SOLN
SUBCUTANEOUS | Status: AC
  Administered 2017-11-09: 10 [IU] via INTRAVENOUS
  Filled 2017-11-09: qty 1

## 2017-11-09 MED ORDER — SODIUM CHLORIDE 0.9 % IV SOLN: INTRAVENOUS | Status: DC | PRN

## 2017-11-09 MED ORDER — CALCIUM CHLORIDE 10 % IV SOLN
INTRAVENOUS | Status: AC
  Filled 2017-11-09: qty 10

## 2017-11-09 MED ORDER — MORPHINE SULFATE (PF) 2 MG/ML IV SOLN: 1.0000 mg | INTRAVENOUS | Status: DC | PRN

## 2017-11-09 MED ORDER — GLYCOPYRROLATE 0.2 MG/ML IJ SOLN: 0.2000 mg | INTRAMUSCULAR | Status: DC | PRN

## 2017-11-09 MED ORDER — SODIUM CHLORIDE 0.9 % IV SOLN
0.0000 ug/min | INTRAVENOUS | Status: DC
  Administered 2017-11-09 (×3): 400 ug/min via INTRAVENOUS
  Administered 2017-11-09: 10 ug/min via INTRAVENOUS
  Filled 2017-11-09 (×3): qty 4
  Filled 2017-11-09: qty 40

## 2017-11-09 MED ORDER — NOREPINEPHRINE 4 MG/250ML-% IV SOLN
INTRAVENOUS | Status: AC
  Administered 2017-11-09: 5 ug/min
  Filled 2017-11-09: qty 250

## 2017-11-09 MED FILL — Medication: Qty: 1 | Status: AC

## 2017-11-10 NOTE — Progress Notes (Signed)
Body transferred to morgue at this time.

## 2017-11-11 ENCOUNTER — Telehealth: Payer: Self-pay | Admitting: Internal Medicine

## 2017-11-11 NOTE — Telephone Encounter (Signed)
[employer] dropped off Death Certificate to be completed and signed °Placed in nurse box  °

## 2017-11-12 NOTE — Telephone Encounter (Signed)
Informed funeral home death cert is ready for pick up. 

## 2017-12-07 NOTE — Progress Notes (Signed)
Provided emotional support to family upon entry to ED and conversation with family. Chaplain continues to be available upon request.

## 2017-12-07 NOTE — ED Triage Notes (Signed)
Pt arrived to the ED via EMS from home for witnessed cardiac arrest, asystole on site and pulses brought back by EMS. IO on right tibia. 3 epis, 1 atropine and 1 bicarb. Dopamine at 7mg /kg. History of heart valve replacement. Dr. Manson Passey at bedside upon arrival.

## 2017-12-07 NOTE — Progress Notes (Signed)
CDS instructed RN to call back with TOD. Not a candidate for organ donation.

## 2017-12-07 NOTE — Progress Notes (Signed)
Chaplain spent time with family waiting on the arrival of patient daughters. The daughters arrived. Chaplain escorted daughters back and gave them time alone with patient. Chaplain went back to waiting area with other family. Chaplain gave spiritual and emotional support. Chaplain prayed with patient wife (Taylor Stephenson). Chaplain gave family time with each other.

## 2017-12-07 NOTE — Progress Notes (Signed)
Pt was transported to CCU from the ED while on the vent. 

## 2017-12-07 NOTE — Progress Notes (Signed)
Spoke with pts wife, son, and several family members regarding plan of care they would like pt extubated and transitioned to comfort measures only.  Therefore, orders placed for comfort care only per pts family request.  Sonda Rumble, AGNP  Pulmonary/Critical Care Pager [phone] (please enter 7 digits) PCCM Consult Pager [phone] (please enter 7 digits)

## 2017-12-07 NOTE — ED Provider Notes (Signed)
[facility] Emergency Department Provider Note    First MD Initiated Contact with Patient Nov 16, 2017 [phone]     (approximate)  I have reviewed the triage vital signs and the nursing notes.  Level 5 caveat: History and physical exam limited secondary to cardiac arrest HISTORY  Chief Complaint Cardiac Arrest    HPI Taylor Stephenson is a 72 y.o. male with below list of chronic medical conditions presents to the emergency department via EMS with history of cardiac arrest which patient's wife noted at 4:30 AM at which point she started CPR.  EMS stated that they administered 3 doses of epinephrine as well as 1 dose of atropine, 1 amp of sodium bicarbonate with return of spontaneous circulation.   Past Medical History:  Diagnosis Date  . Aortic valve disease    a. s/p bioprosthetic AVR in ~ 2014  . CHF (congestive heart failure) ([place])   . CKD (chronic kidney disease), stage II   . Diabetes mellitus with complication ([place])   . Hypertension   . Morbid obesity ([place])   . OSA (obstructive sleep apnea)    a. on CPAP    Patient Active Problem List   Diagnosis Date Noted  . Acute on chronic diastolic CHF (congestive heart failure) ([place]) 09/21/2017  . Lymphedema 09/09/2017  . Chronic diastolic heart failure ([place]) 07/30/2017  . HTN (hypertension) 07/30/2017  . Diabetes ([place]) 07/30/2017  . Acute CHF (congestive heart failure) ([place]) 07/14/2017    Past Surgical History:  Procedure Laterality Date  . AORTA SURGERY    . APPENDECTOMY    . CARDIAC SURGERY     heart valve replaced    Prior to Admission medications   Medication Sig Start Date End Date Taking? Authorizing Provider  acetaminophen (TYLENOL) 500 MG tablet Take 500-1,000 mg by mouth every 6 (six) hours as needed.    [provider]  acidophilus (RISAQUAD) CAPS capsule Take 1 capsule by mouth daily.     [provider]  albuterol (PROVENTIL HFA;VENTOLIN HFA) 108 (90 Base) MCG/ACT inhaler  Inhale 2 puffs into the lungs every 6 (six) hours as needed for wheezing or shortness of breath.    [provider]  ammonium lactate (LAC-HYDRIN) 12 % lotion Apply 1 application topically as needed for dry skin.    [provider]  aspirin EC 81 MG tablet Take 81 mg by mouth daily.    [provider]  atorvastatin (LIPITOR) 40 MG tablet Take 40 mg by mouth at bedtime.    [provider]  carvedilol (COREG) 12.5 MG tablet Take 12.5 mg by mouth 2 (two) times daily with a meal.    [provider]  glucagon (GLUCAGON EMERGENCY) 1 MG injection Inject 1 mg into the vein once as needed (emergency hypoglycemia).    [provider]  insulin glargine (LANTUS) 100 UNIT/ML injection Inject 40 Units into the skin 2 (two) times daily.     [provider]  insulin regular (NOVOLIN R,HUMULIN R) 100 units/mL injection Inject 20 Units into the skin 3 (three) times daily before meals. If blood sugar is over 125.    [provider]  lisinopril (PRINIVIL,ZESTRIL) 30 MG tablet Take 1 tablet by mouth daily.    [provider]  omeprazole (PRILOSEC) 20 MG capsule Take 20 mg by mouth daily.    [provider]  potassium chloride SA (K-DUR,KLOR-CON) 20 MEQ tablet Take 1 tablet (20 mEq total) by mouth daily. 07/19/17   Fritzi Mandes, MD  sertraline (ZOLOFT) 50 MG tablet Take 1 tablet by mouth daily.     [provider]  tamsulosin (FLOMAX) 0.4 MG CAPS capsule Take 0.4 mg by mouth daily.    [provider]  Tiotropium Bromide-Olodaterol 2.5-2.5 MCG/ACT AERS Inhale 2 puffs into the lungs 2 (two) times daily.     [provider]  torsemide (DEMADEX) 20 MG tablet Take 2 tablets (40 mg total) by mouth daily. 09/24/17   Mayo, Pete Pelt, MD    Allergies Latex  Family History  Problem Relation Age of Onset  . Diabetes Mother   . Diabetes Father   . Kidney disease Father   . Diabetes Sister     Social  History Social History   Tobacco Use  . Smoking status: Former Smoker    Types: Cigarettes  . Smokeless tobacco: Never Used  Substance Use Topics  . Alcohol use: Not on file  . Drug use: Not on file    Review of Systems Unable to obtain secondary to cardiac arrest   ____________________________________________   PHYSICAL EXAM:  VITAL SIGNS: ED Triage Vitals  Enc Vitals Group     BP 12-05-17 0615 (!) 90/53     Pulse Rate 12-05-2017 0615 67     Resp 12/05/2017 0615 20     Temp 2017/12/05 0645 (!) 96.6 F (35.9 C)     Temp src --      SpO2 12/05/17 0610 97 %     Weight --      Height --      Head Circumference --      Peak Flow --      Pain Score --      Pain Loc --      Pain Edu? --      Excl. in [place]? --     Constitutional: Unresponsive  eyes: Pupils fixed and nonreactive no corneal reflex Head: Atraumatic. Mouth/Throat: Mucous membranes are moist. Oropharynx non-erythematous. Cardiovascular: Normal rate, irregular rhythm. Good peripheral circulation. Grossly normal heart sounds. Respiratory: Normal respiratory effort.  No retractions. Lungs CTAB. Gastrointestinal: Soft and nontender. No distention.  Musculoskeletal: No lower extremity tenderness nor edema. No gross deformities of extremities. Neurologic: Unresponsive to verbal and noxious stimuli Skin:  Skin is warm, dry and intact. No rash noted.   ____________________________________________   LABS (all labs ordered are listed, but only abnormal results are displayed)  Labs Reviewed  GLUCOSE, CAPILLARY - Abnormal; Notable for the following components:      Result Value   Glucose-Capillary 284 (*)    All other components within normal limits  BASIC METABOLIC PANEL - Abnormal; Notable for the following components:   Potassium 6.7 (*)    CO2 19 (*)    Glucose, Bld 335 (*)    BUN 69 (*)    Creatinine, Ser 2.68 (*)    Calcium 8.2 (*)    GFR calc non Af Amer 22 (*)    GFR calc Af Amer 26 (*)    Anion gap 16  (*)    All other components within normal limits  CBC - Abnormal; Notable for the following components:   WBC 13.1 (*)    RBC 3.48 (*)    Hemoglobin 10.3 (*)    HCT 32.4 (*)    MCHC 31.8 (*)    RDW 19.9 (*)    Platelets 129 (*)    All other components within normal limits  TROPONIN I - Abnormal; Notable for the following components:   Troponin  I 0.07 (*)    All other components within normal limits   ____________________________________________  EKG  ED ECG REPORT I, [place] N Jasaun Carn, the attending physician, personally viewed and interpreted this ECG.   Date: Dec 08, 2017  EKG Time: 6:13 AM  Rate: 61  Rhythm: Idioventricular appearing rhythm with wide QRS  Axis: Left axis deviation  Intervals: Irregular RR interval  ST&T Change:None ____________________________  RADIOLOGY I, [place] N Domanick Cuccia, personally viewed and evaluated these images (plain radiographs) as part of my medical decision making, as well as reviewing the written report by the radiologist.  ED MD interpretation: Endotracheal tube just above the carina  Official radiology report(s): Dg Chest Port 1 View  Result Date: 12/08/17 CLINICAL DATA:  72 year old male with cardiac arrest. EXAM: PORTABLE CHEST 1 VIEW COMPARISON:  Chest radiograph dated 09/20/2017 FINDINGS: Endotracheal tube with tip at the level of the right fifth costovertebral junction, likely at or above the level of the carina. Recommend retraction by approximately 2-3 cm for optimal positioning. There is shallow inspiration with bibasilar atelectasis. No focal consolidation, pleural effusion, or pneumothorax. Stable cardiomegaly. Median sternotomy wires and mechanical heart valve. Atherosclerotic calcification of the aortic arch. No acute osseous pathology. IMPRESSION: 1. Endotracheal tube at or just above the carina. Recommend retraction by approximately 2-3 cm for optimal positioning. 2. No acute cardiopulmonary process. Electronically Signed   By:  Anner Crete M.D.   On: 12/08/2017 06:51    .Critical Care Performed by: Gregor Hams, MD Authorized by: Gregor Hams, MD   Critical care provider statement:    Critical care time (minutes):  60   Critical care time was exclusive of:  Separately billable procedures and treating other patients   Critical care was necessary to treat or prevent imminent or life-threatening deterioration of the following conditions:  Cardiac failure, circulatory failure and respiratory failure   Critical care was time spent personally by me on the following activities:  Development of treatment plan with patient or surrogate, discussions with consultants, evaluation of patient's response to treatment, examination of patient, obtaining history from patient or surrogate, ordering and performing treatments and interventions, ordering and review of laboratory studies, ordering and review of radiographic studies, pulse oximetry, re-evaluation of patient's condition and review of old charts  Procedure Name: Intubation Date/Time: 2017/12/08 10:40 PM Performed by: Gregor Hams, MD Pre-anesthesia Checklist: Patient identified, Emergency Drugs available, Suction available and Patient being monitored Preoxygenation: Pre-oxygenation with 100% oxygen Induction Type: IV induction and Rapid sequence Laryngoscope Size: Glidescope, Mac and 4 Tube size: 7.5 mm Number of attempts: 1 Placement Confirmation: ETT inserted through vocal cords under direct vision,  CO2 detector and Breath sounds checked- equal and bilateral Secured at: 25 cm Dental Injury: Teeth and Oropharynx as per pre-operative assessment  Difficulty Due To: Difficulty was unanticipated        ____________________________________________   INITIAL IMPRESSION / ASSESSMENT AND PLAN / ED COURSE  As part of my medical decision making, I reviewed the following data within the electronic MEDICAL RECORD NUMBER  72 year old male presenting with  above-stated history and physical exam secondary to cardiac arrest.  Endotracheal intubation was performed by me immediately upon arrival to the emergency departmen.  Patient hypotensive on arrival to the emergency department and as such dopamine was initiated with minimal improvement in blood pressure and as such Levophed was also added.  Patient noted to have rhythm consistent with possible hyperkalemia and as such a total of 2 A of calcium chloride was administered  as well as sodium bicarb 1 amp albuterol nebulized treatment was administered and 10 units of insulin with an amp of D50.  Patient's QRS complex is narrowed following calcium administration.  Patient discussed with Dr. Anselm Jungling for hospital admission for further evaluation and management of critically ill patient     ____________________________________________  FINAL CLINICAL IMPRESSION(S) / ED DIAGNOSES  Final diagnoses:  Cardiac arrest ([place])  Hyperkalemia     MEDICATIONS GIVEN DURING THIS VISIT:  Medications  dextrose 50 % solution (12.5 g Intravenous Given 11-10-2017 0655)  norepinephrine (LEVOPHED) 4-5 MG/250ML-% infusion SOLN (5 mcg/min  New Bag/Given 11-10-17 0637)  DOPamine (INTROPIN) 800 mg in dextrose 5 % 250 mL (3.2 mg/mL) infusion ( Intravenous Rate/Dose Change November 10, 2017 0631)  calcium chloride injection (1 g Intravenous Given November 10, 2017 0644)  calcium chloride injection (1 g Intravenous Given 11-10-17 0701)  insulin aspart (novoLOG) injection 10 Units (10 Units Intravenous Given 10-Nov-2017 0656)     ED Discharge Orders    None       Note:  This document was prepared using Dragon voice recognition software and may include unintentional dictation errors.    Gregor Hams, MD 11-10-2017 [phone]

## 2017-12-07 NOTE — Progress Notes (Signed)
Increased FIO2 to 100% and PEEP to 10 cmH2O due to sats of 80%,sats gradually increased to 90%.

## 2017-12-07 NOTE — Progress Notes (Signed)
Chaplain received page that patient had expired. Chaplain went to give condolence to family. Family thank Chaplain for being a trooper with them all day. Chaplain said it was a pleasure going through their process with them. Chaplain embrace each family member and sent blessing to continue the planning of the days to come.

## 2017-12-07 NOTE — Progress Notes (Signed)
Initial Nutrition Assessment  DOCUMENTATION CODES:   Morbid obesity  INTERVENTION:  Patient not appropriate for enteral nutrition at this time.  Once patient is rewarmed and hemodynamically stable, can consider tube feed initiation if family still desires full scope of care.  If tube feeds are initiated, recommend a goal TF regimen of Nepro at 25 mL/hr (600 mL goal daily volume) + Pro-Stat 60 mL QID per OGT. Provides 1880 kcal, 169 grams of protein, 432 mg phosphorus, 636 mg potassium, 438 mL H2O daily.  Provide liquid MVI daily per tube if tube feeds are initiated as goal TF regimen does not meet 100% RDIs for vitamins/minerals.  If patient initiates on CRRT recommend Ocuvite daily per tube and B complex with C QHS per tube to replace micronutrient losses on CRRT.  NUTRITION DIAGNOSIS:   Inadequate oral intake related to inability to eat as evidenced by NPO status.  GOAL:   Provide needs based on ASPEN/SCCM guidelines  MONITOR:   Vent status, Labs, Weight trends, TF tolerance, I & O's  REASON FOR ASSESSMENT:   Ventilator    ASSESSMENT:   72 year old male with PMHx of HTN, OSA, aortic valve disease s/p bioprosthetic AVR in 2014, CKD stage III, CHF, lymphedema admitted after witnessed cardiac arrest at home requiring prolonged ACLS (45 minutes) and initiating 36C TTM protocol, acute respiratory failure requiring intubation on 9/3, oliguric acute renal failure over CKD stage III, severe hyperkalemia, severe hyperphosphatemia, suspected severe anoxic brain injury.   Patient is intubated and sedated. On PRVC mode with FiO2 100% and PEEP 10 cmH2O. Abdomen distended and taut. Patient will be started on 36C TTM protocol today. Per chart patient was 137.2-140.3 kg earlier this year and current weight is 127.4 kg. Weight fluctuation could be related to fluid as patient with history of CHF and CKD.  Access: 16 FR. OGT placed 9/3; terminates in distal stomach per abdominal x-ray  9/3  MAP: 51-61 mmHg  Patient is currently intubated on ventilator support Ve: 6.8 L/min Temp (24hrs), Avg:97 F (36.1 C), Min:96.6 F (35.9 C), Max:97.5 F (36.4 C)  Propofol: N/A  Medications reviewed and include: dopamine gtt at 20 mcg/kg/min, norepinephrine gtt at 32 mcg/min, famotidine, fentanyl gtt, regular insulin gtt, Keppra, Zosyn, sodium bicarbonate 150 mEQ in sterile water at 75 mL/hr, vasopressin gtt.  Labs reviewed: CBG 277-309, Potassium 6, CO2 20, BUN 75, Creatinine 2.8, Phosphorus 9.2, Magnesium 2.5.  I/O: no intake/output data at this time  Patient does not meet criteria for malnutrition at this time.  Discussed with RN and on rounds. For now family desires full scope of care. Patient may require CRRT.  NUTRITION - FOCUSED PHYSICAL EXAM:    Most Recent Value  Orbital Region  No depletion  Upper Arm Region  No depletion  Thoracic and Lumbar Region  No depletion  Buccal Region  Unable to assess  Temple Region  No depletion  Clavicle Bone Region  No depletion  Clavicle and Acromion Bone Region  No depletion  Scapular Bone Region  Unable to assess  Dorsal Hand  No depletion  Patellar Region  No depletion  Anterior Thigh Region  No depletion  Posterior Calf Region  No depletion  Edema (RD Assessment)  None  Hair  Reviewed  Eyes  Unable to assess  Mouth  Unable to assess  Skin  Reviewed  Nails  Reviewed     Diet Order:   Diet Order    None      EDUCATION NEEDS:  No education needs have been identified at this time  Skin:  Skin Assessment: Reviewed RN Assessment(RN skin assessment not yet completed)  Last BM:  Unknown/PTA  Height:   Ht Readings from Last 1 Encounters:  10/07/17 5\' 7"  (1.702 m)    Weight:   Wt Readings from Last 1 Encounters:  Dec 02, 2017 127.4 kg    Ideal Body Weight:  67.3 kg(calculated with height of 5\' 7"  from previous encounter)  BMI:  Body mass index is 43.99 kg/m.  Estimated Nutritional Needs:   Kcal:   1401-1784 (11-14 kcal/kg)  Protein:  168 grams (2.5 grams/kg IBW)  Fluid:  1.2-1.5 L/day  Helane Rima, MS, RD, LDN Office: [phone] Pager: [phone] After Hours/Weekend Pager: [phone]

## 2017-12-07 NOTE — Progress Notes (Signed)
Chaplain was taking care of another family and patient family was in waiting area. Chaplain check on family to see how they were doing. Wife said she was at peace. Chaplain told her she would check in on her and family.Wife said thank you.

## 2017-12-07 NOTE — Progress Notes (Signed)
No valuables at bedside. Family said goodbyes and will notify House Supervisor in the morning regarding which funeral home they choose.

## 2017-12-07 NOTE — Progress Notes (Signed)
Notified Dr. Duanne Limerick of Arterial Pressure 77/40. Increased Epinephrine drip to max per Dr. Duanne Limerick and see new orders per. Will continue to monitor.

## 2017-12-07 NOTE — Consult Note (Signed)
Date: 12-04-17                  Patient Name:  Robertocarlos Velador  MRN: [id number]  DOB: [birth year]  Age / Sex: 72 y.o., male         PCP: Dellia Cloud, MD                 Service Requesting Consult: IM/ Altamese Dilling, *                 Reason for Consult: ARF, Hyperkalemia            History of Present Illness: Patient is a 72 y.o. male with medical problems of diabetes, aortic valve replacement 2014, congestive heart failure, chronic kidney disease, hypertension, obstructive sleep apnea, lymphedema, LVH, EF 55 to 60% from echo May 2019, grade 2 diastolic dysfunction who was admitted to [facility] on December 04, 2017 .  Patient arrived to the ER via EMS after a witnessed cardiac arrest.  He was asystolic.  Received ACLS support  epinephrine, atropine and bicarbonate. Patient was intubated and placed on ventilator support.  Initially his potassium was 6.7 which has come down to 5.9 with medical shifting measures.  His baseline creatinine appears to be 1.52/GFR 44 from July 19.  Admission creatinine 2.68 Information obtained from the chart and patient's daughter-in-law is in the room with him  Medications: Outpatient medications: Medications Prior to Admission  Medication Sig Dispense Refill Last Dose  . acetaminophen (TYLENOL) 500 MG tablet Take 500-1,000 mg by mouth every 6 (six) hours as needed.   Taking  . acidophilus (RISAQUAD) CAPS capsule Take 1 capsule by mouth daily.    Taking  . albuterol (PROVENTIL HFA;VENTOLIN HFA) 108 (90 Base) MCG/ACT inhaler Inhale 2 puffs into the lungs every 6 (six) hours as needed for wheezing or shortness of breath.   Taking  . ammonium lactate (LAC-HYDRIN) 12 % lotion Apply 1 application topically as needed for dry skin.   Taking  . aspirin EC 81 MG tablet Take 81 mg by mouth daily.   Taking  . atorvastatin (LIPITOR) 80 MG tablet Take 40 mg by mouth daily.     . carvedilol (COREG) 12.5 MG tablet Take 12.5 mg by mouth 2 (two) times daily with a meal.    Taking  . insulin glargine (LANTUS) 100 UNIT/ML injection Inject 40 Units into the skin 2 (two) times daily.    Taking  . insulin regular (NOVOLIN R,HUMULIN R) 100 units/mL injection Inject 20 Units into the skin 3 (three) times daily before meals. If blood sugar is over 125.   Taking  . lisinopril (PRINIVIL,ZESTRIL) 20 MG tablet Take 30 mg by mouth daily.     Marland Kitchen omeprazole (PRILOSEC) 20 MG capsule Take 20 mg by mouth daily.   Taking  . sertraline (ZOLOFT) 50 MG tablet Take 1 tablet by mouth daily.    Taking  . tamsulosin (FLOMAX) 0.4 MG CAPS capsule Take 0.4 mg by mouth daily.   Taking  . Tiotropium Bromide-Olodaterol 2.5-2.5 MCG/ACT AERS Inhale 2 puffs into the lungs 2 (two) times daily.    Taking  . torsemide (DEMADEX) 20 MG tablet Take 2 tablets (40 mg total) by mouth daily. 60 tablet 0 Taking  . glucagon (GLUCAGON EMERGENCY) 1 MG injection Inject 1 mg into the vein once as needed (emergency hypoglycemia).   Taking  . potassium chloride SA (K-DUR,KLOR-CON) 20 MEQ tablet Take 1 tablet (20 mEq total) by mouth daily. (Patient not taking: Reported  on 14-Nov-2017) 30 tablet 0 Not Taking at Unknown time    Current medications: Current Facility-Administered Medications  Medication Dose Route Frequency Provider Last Rate Last Dose  . dextrose 50 % solution   Intravenous PRN Darci Current, MD   12.5 g at 2017-11-14 0655  . DOPamine (INTROPIN) 3.2-5 MG/ML-% infusion           . DOPamine (INTROPIN) 800 mg in dextrose 5 % 250 mL (3.2 mg/mL) infusion  0-20 mcg/kg/min Intravenous Titrated Samaan, Maged, MD      . fentaNYL (SUBLIMAZE) bolus via infusion 25 mcg  25 mcg Intravenous Q1H PRN Samaan, Maged, MD      . fentaNYL in NS (2mcg/ml) infusion-PREMIX  25-400 mcg/hr Intravenous Continuous Uvaldo Rising, MD 10 mL/hr at 2017/11/14 0940 100 mcg/hr at November 14, 2017 0940  . levETIRAcetam (KEPPRA) IVPB 500 mg/100 mL premix  500 mg Intravenous Q12H Samaan, Maged, MD      . norepinephrine (LEVOPHED) 16  mg in D5W premix infusion  0-40 mcg/min Intravenous Titrated Altamese Dilling, MD      . piperacillin-tazobactam (ZOSYN) IVPB 3.375 g  3.375 g Intravenous Q8H Altamese Dilling, MD      . sodium bicarbonate 150 mEq in sterile water 1,000 mL infusion   Intravenous Continuous Altamese Dilling, MD      . sodium chloride 0.9 % bolus 1,000 mL  1,000 mL Intravenous Once Uvaldo Rising, MD          Allergies: Allergies  Allergen Reactions  . Latex       Past Medical History: Past Medical History:  Diagnosis Date  . Aortic valve disease    a. s/p bioprosthetic AVR in ~ 2014  . CHF (congestive heart failure) (HCC)   . CKD (chronic kidney disease), stage II   . Diabetes mellitus with complication (HCC)   . Hypertension   . Morbid obesity (HCC)   . OSA (obstructive sleep apnea)    a. on CPAP     Past Surgical History: Past Surgical History:  Procedure Laterality Date  . AORTA SURGERY    . APPENDECTOMY    . CARDIAC SURGERY     heart valve replaced     Family History: Family History  Problem Relation Age of Onset  . Diabetes Mother   . Diabetes Father   . Kidney disease Father   . Diabetes Sister      Social History: Social History   Socioeconomic History  . Marital status: Married    Spouse name: Not on file  . Number of children: Not on file  . Years of education: Not on file  . Highest education level: Not on file  Occupational History  . Not on file  Social Needs  . Financial resource strain: Not on file  . Food insecurity:    Worry: Not on file    Inability: Not on file  . Transportation needs:    Medical: Not on file    Non-medical: Not on file  Tobacco Use  . Smoking status: Former Smoker    Types: Cigarettes  . Smokeless tobacco: Never Used  Substance and Sexual Activity  . Alcohol use: Not on file  . Drug use: Not on file  . Sexual activity: Not on file  Lifestyle  . Physical activity:    Days per week: Not on file     Minutes per session: Not on file  . Stress: Not on file  Relationships  . Social connections:  Talks on phone: Not on file    Gets together: Not on file    Attends religious service: Not on file    Active member of club or organization: Not on file    Attends meetings of clubs or organizations: Not on file    Relationship status: Not on file  . Intimate partner violence:    Fear of current or ex partner: Not on file    Emotionally abused: Not on file    Physically abused: Not on file    Forced sexual activity: Not on file  Other Topics Concern  . Not on file  Social History Narrative  . Not on file     Review of Systems: Not available as patient is intubated and requiring ventilator support Gen:  HEENT:  CV:  Resp:  GI: GU :  MS:  Derm:   Psych: Heme:  Neuro:  Endocrine  Vital Signs: Blood pressure (!) 100/44, pulse 77, temperature (!) 97.5 F (36.4 C), resp. rate 16, weight 127.4 kg, SpO2 100 %.  No intake or output data in the 24 hours ending 2017/11/11 1032  Weight trends: [employer]   2017-11-11 0900  Weight: 127.4 kg    Physical Exam: General:  Critically ill-appearing  HEENT  ET tube, OG tube in place  Lungs:  Ventilator assisted, FiO2  Heart::  Regular rate and rhythm  Abdomen:  Obese, nondistended, nontender  Extremities:  No peripheral edema  Neurologic:  Nonresponsive to voice or tactile stimuli, pupils 5 to 6 mm.  No response to light.  Skin:  Dry skin  Dialysis Access:   Foley:  In place       Lab results: Basic Metabolic Panel: Recent Labs  Lab 2017-11-11 0612 Nov 11, 2017 0919  NA 136 133*  K 6.7* 5.9*  CL 101 101  CO2 19* 20*  GLUCOSE 335* 313*  BUN 69* 75*  CREATININE 2.68* 2.69*  CALCIUM 8.2* 9.1  MG  --  2.5*    Liver Function Tests: No results for input(s): AST, ALT, ALKPHOS, BILITOT, PROT, ALBUMIN in the last 168 hours. No results for input(s): LIPASE, AMYLASE in the last 168 hours. No results for input(s): AMMONIA in  the last 168 hours.  CBC: Recent Labs  Lab 2017/11/11 0612  WBC 13.1*  HGB 10.3*  HCT 32.4*  MCV 93.2  PLT 129*    Cardiac Enzymes: Recent Labs  Lab 11-11-2017 0612  TROPONINI 0.07*    BNP: Invalid input(s): POCBNP  CBG: Recent Labs  Lab 11-11-2017 0611 Nov 11, 2017 0837 11/11/17 0911  GLUCAP 284* 309* 277*    Microbiology: No results found for this or any previous visit (from the past 720 hour(s)).   Coagulation Studies: No results for input(s): LABPROT, INR in the last 72 hours.  Urinalysis: No results for input(s): COLORURINE, LABSPEC, PHURINE, GLUCOSEU, HGBUR, BILIRUBINUR, KETONESUR, PROTEINUR, UROBILINOGEN, NITRITE, LEUKOCYTESUR in the last 72 hours.  Invalid input(s): APPERANCEUR      Imaging: Ct Head Wo Contrast  Result Date: 11-11-2017 CLINICAL DATA:  72 year old male status post cardiac arrest and resuscitation EXAM: CT HEAD WITHOUT CONTRAST TECHNIQUE: Contiguous axial images were obtained from the base of the skull through the vertex without intravenous contrast. COMPARISON:  None. FINDINGS: Brain: No evidence of acute infarction, hemorrhage, hydrocephalus, extra-axial collection or mass lesion/mass effect. Vascular: Calcifications are present in both cavernous carotid arteries. Skull: Normal. Negative for fracture or focal lesion. Sinuses/Orbits: No acute finding. Other: None. IMPRESSION: No acute intracranial abnormality. Specifically, no evidence of acute  intracranial hemorrhage. Electronically Signed   By: Malachy Moan M.D.   On: 12-06-2017 09:00   Dg Chest Port 1 View  Result Date: 12-06-2017 CLINICAL DATA:  72 year old male with cardiac arrest. EXAM: PORTABLE CHEST 1 VIEW COMPARISON:  Chest radiograph dated 09/20/2017 FINDINGS: Endotracheal tube with tip at the level of the right fifth costovertebral junction, likely at or above the level of the carina. Recommend retraction by approximately 2-3 cm for optimal positioning. There is shallow inspiration with  bibasilar atelectasis. No focal consolidation, pleural effusion, or pneumothorax. Stable cardiomegaly. Median sternotomy wires and mechanical heart valve. Atherosclerotic calcification of the aortic arch. No acute osseous pathology. IMPRESSION: 1. Endotracheal tube at or just above the carina. Recommend retraction by approximately 2-3 cm for optimal positioning. 2. No acute cardiopulmonary process. Electronically Signed   By: Elgie Collard M.D.   On: 12-06-2017 06:51      Assessment & Plan: Pt is a 72 y.o. Caucasian  male with medical problems of diabetes, aortic valve replacement 2014 (mechanical), congestive heart failure, chronic kidney disease, hypertension, obstructive sleep apnea, lymphedema, LVH, EF 55 to 60% from echo May 2019, grade 2 diastolic dysfunction, was admitted on 12-06-17 with witnessed cardiac arrest at home, requiring prolonged (45 minutes) ACLS  1.  Acute renal failure, oliguric 2.  Chronic kidney disease stage III.  Baseline creatinine 1.5/GFR 44 from September 23, 2017 3.  Diabetes type 2 with chronic kidney disease 4.  Acute respiratory failure, requiring ventilator support 5.  Severe hyperkalemia 6.  Severe hyperphosphatemia  Patient is currently critically ill.  Acute renal failure likely due to severe ATN from hypoperfusion during ACLS versus acute kidney injury that happened couple days prior to today's event.  Patient was on lisinopril and potassium supplementation which may have led to hyperkalemia. At present, patient is oliguric, requiring pressor support and ventilator support.  He is suspected to have severe anoxic brain injury.  CT of the head is negative for bleed.  Currently getting aggressive care as per family request. Agree with IV bicarbonate supplementation and using medical shifting measures for hyperkalemia No acute indication for hemodialysis at present but if hyperkalemia becomes refractory, dialysis can be considered. Check CK to evaluate for  rhabdomyolysis Follow potassium level closely Dicussed case with IM, ICU team and family Baird Lyons, Daughter in Social worker)    LOS: 0 Mosetta Pigeon 09/30/201910:32 AM  [facility] [place], [place] [phone]  Note: This note was prepared with Dragon dictation. Any transcription errors are unintentional

## 2017-12-07 NOTE — Progress Notes (Signed)
Chaplain received PG that patient was move to ICU. Chaplain arrived at ICU and the nurse explain that the team was working on patient and Chaplain ask were there family here and nurse stated the wife had just buzz in. Nurse ask if  Chaplain could go find wife. Chaplain went out and wife and children were waiting in hall way. Chaplain introduced herself to family and wife introduced family to [place]. The nurse came to get family to visit patient and wife cling to [place] and doctor came in to explain what was going on. Chaplain gave emotional support and prayer to family. Wife decided to go home and process everything. Chaplain walked wife out with son and told her she would be here when she returns. Wife thanked Orthoptist for everything.

## 2017-12-07 NOTE — Progress Notes (Signed)
Spoke with pts wife and son regarding pts code status and plan of care.  They both stated they did not want pt to receive any form of dialysis, and they would like to change pts code status to DO NOT RESUSCITATE.  They understand the pt is currently receiving multiple vasopressors at maximum doses and agree to current treatment for now.  Will continue to monitor and assess pt.  Sonda Rumble, AGNP  Pulmonary/Critical Care Pager [phone] (please enter 7 digits) PCCM Consult Pager [phone] (please enter 7 digits)

## 2017-12-07 NOTE — Progress Notes (Signed)
Notified Dr. Mcneil Sober of arterial map in 40's. Bolus started per Dr. Duanne Limerick. See new orders.

## 2017-12-07 NOTE — Progress Notes (Signed)
*  PRELIMINARY RESULTS* Echocardiogram 2D Echocardiogram has been performed.  Joanette Gula Jimmy Stipes 2017-12-04, 2:39 PM

## 2017-12-07 NOTE — Progress Notes (Signed)
Chaplain spend time with family in waiting room. The family is awaiting the arrival of the patient two daughters, before proceeding with the care plan of patient.

## 2017-12-07 NOTE — Progress Notes (Signed)
Pt extubated per NP order at 23:14.

## 2017-12-07 NOTE — ED Notes (Signed)
Levophed titrated to 15 per MD Manson Passey verbal order

## 2017-12-07 NOTE — ED Notes (Signed)
Spoke with RN Stacy who asked to be called back in 5 minutes

## 2017-12-07 NOTE — Consult Note (Addendum)
Name: Taylor Stephenson MRN: [id number] DOB: 13-Sep-1945     CONSULTATION DATE: 11/13/2017   HISTORY OF PRESENT ILLNESS:   72 years old man with history of diabetes chronic kidney disease, hypertension obstructive sleep apnea, congestive heart failure with preserved ejection fraction 55 to 60% and grade 2 diastolic dysfunction, status post aortic valve replacement in 2014. Patient had a witnessed cardiac arrest at his residence initial rhythm asystole, second cardiac arrest during transportation and continued to have resuscitation till [place] was obtained in the emergency room.  He was hypotensive and had to be started on pressors Patient arrived to ICU on the ventilator, norepinephrine + dopamine was partial seizure noticed on the facial muscles. All history was obtained from admitting physician, family who arrived at the bedside and EMR.  PAST MEDICAL HISTORY :   has a past medical history of Aortic valve disease, CHF (congestive heart failure) ([place]), CKD (chronic kidney disease), stage II, Diabetes mellitus with complication ([place]), Hypertension, Morbid obesity ([place]), and OSA (obstructive sleep apnea).  has a past surgical history that includes Aorta surgery; Appendectomy; and Cardiac surgery. Prior to Admission medications   Medication Sig Start Date End Date Taking? Authorizing Provider  acetaminophen (TYLENOL) 500 MG tablet Take 500-1,000 mg by mouth every 6 (six) hours as needed.   Yes [provider]  acidophilus (RISAQUAD) CAPS capsule Take 1 capsule by mouth daily.    Yes [provider]  albuterol (PROVENTIL HFA;VENTOLIN HFA) 108 (90 Base) MCG/ACT inhaler Inhale 2 puffs into the lungs every 6 (six) hours as needed for wheezing or shortness of breath.   Yes [provider]  ammonium lactate (LAC-HYDRIN) 12 % lotion Apply 1 application topically as needed for dry skin.   Yes [provider]  aspirin EC 81 MG tablet Take 81 mg by mouth daily.   Yes  [provider]  atorvastatin (LIPITOR) 80 MG tablet Take 40 mg by mouth daily. 10/02/17  Yes [provider]  carvedilol (COREG) 12.5 MG tablet Take 12.5 mg by mouth 2 (two) times daily with a meal.   Yes [provider]  insulin glargine (LANTUS) 100 UNIT/ML injection Inject 40 Units into the skin 2 (two) times daily.    Yes [provider]  insulin regular (NOVOLIN R,HUMULIN R) 100 units/mL injection Inject 20 Units into the skin 3 (three) times daily before meals. If blood sugar is over 125.   Yes [provider]  lisinopril (PRINIVIL,ZESTRIL) 20 MG tablet Take 30 mg by mouth daily. 10/02/17  Yes [provider]  omeprazole (PRILOSEC) 20 MG capsule Take 20 mg by mouth daily.   Yes [provider]  sertraline (ZOLOFT) 50 MG tablet Take 1 tablet by mouth daily.    Yes [provider]  tamsulosin (FLOMAX) 0.4 MG CAPS capsule Take 0.4 mg by mouth daily.   Yes [provider]  Tiotropium Bromide-Olodaterol 2.5-2.5 MCG/ACT AERS Inhale 2 puffs into the lungs 2 (two) times daily.    Yes [provider]  torsemide (DEMADEX) 20 MG tablet Take 2 tablets (40 mg total) by mouth daily. 09/24/17  Yes Mayo, Pete Pelt, MD  glucagon (GLUCAGON EMERGENCY) 1 MG injection Inject 1 mg into the vein once as needed (emergency hypoglycemia).    [provider]  potassium chloride SA (K-DUR,KLOR-CON) 20 MEQ tablet Take 1 tablet (20 mEq total) by mouth daily. Patient not taking: Reported on 13-Nov-2017 07/19/17   Fritzi Mandes, MD   Allergies  Allergen Reactions  . Latex  FAMILY HISTORY:  family history includes Diabetes in his father, mother, and sister; Kidney disease in his father. SOCIAL HISTORY:  reports that he has quit smoking. His smoking use included cigarettes. He has never used smokeless tobacco.  REVIEW OF SYSTEMS:   Unable to obtain due to critical illness   VITAL SIGNS: Temp:  [96.6 F (35.9 C)-97.5 F  (36.4 C)] 97.5 F (36.4 C) (09/03 0900) Pulse Rate:  [64-77] 77 (09/03 0900) Resp:  [16-21] 16 (09/03 0900) BP: (75-105)/(31-53) 100/44 (09/03 0900) SpO2:  [91 %-100 %] 100 % (09/03 0900) FiO2 (%):  [50 %-70 %] 50 % (09/03 0900) Weight:  [127.4 kg] 127.4 kg (09/03 0900)  Physical Examination:  Unresponsive, no corneal, no gag, no doll's, no spontaneous movement, no DTR, triggering the ventilator.  Partial seizure periocular and perioral muscles On vent, no distress, bilateral equal air entry with no adventitious sounds S1 & S2 are audible with no murmur Benign abdominal exam with feeble peristalsis No leg edema   ASSESSMENT / PLAN:  Acute respiratory failure intubated during ACLS. -Monitor ABG, optimize ventilator settings and continuous ventilator support.  Cardiogenic shock and NSTEMI status post cardiac arrest.  A. fib and RBBB on EKG. troponin 0 0.07 status post CPR.  Echocardiogram May 2019 LVEF 55 to 24% grade 2 diastolic dysfunction no regional wall motion abnormalities.  On norepinephrine and dopamine -Optimize pressors and monitor hemodynamics -Management as per cardiology, planning for cardiac cath with meaningful neurological recovery  Altered mental status and partial seizure with anoxic encephalopathy status post cardiac arrest.  No acute intracranial abnormalities and CT head -Start on induced hypothermia -Keppra  AKI, metabolic acidosis with hyperkalemia on top of CKD -Optimize bicarbonate based solution -Avoid nephrotoxins, monitor renal panel and urine output -Management as per nephrology with consideration for RRT.  Atelectasis and pneumonia with possible aspiration.  Bibasilar airspace disease -Empiric Zosyn.  Monitor CXR + CBC + FiO2.  Lactic acidemia secondary to tissue hypoperfusion status post cardiac arrest -Optimize hydration and monitor lactic acid  Uncontrolled diabetes mellitus -Glycemic control and monitor hemoglobin A1c  Anemia -Keep  hemoglobin more than 7 g/dL  Thrombocytopenia -Monitor platelet count  Elevated liver enzymes with acute ischemic hepatitis -Optimize hemodynamics, avoid hepatotoxins, monitor liver enzymes and liver ultrasound.  Full code as discussed with the family for now  DVT & GI prophylaxis.  Continue with supportive care  Critical care time 50 minutes  I met with the family ( wife and son).  They requested to change her CODE STATUS to DO NOT RESUSCITATE, continue with mechanical ventilation and medical management however no heroic measures no renal replacement therapy and discontinue hypothermia.  Family are awaiting 2 more daughters traveling from out of the state.

## 2017-12-07 NOTE — Progress Notes (Signed)
CDS notified of patient's status. Awaiting return phone call to determine if patient is a candidate.

## 2017-12-07 NOTE — Progress Notes (Signed)
Family at bedside of patient. Patient's kids arrived from out of state and are currently spending time with their father. Family aware of the process of making patient comfort care and are to let us know when they are ready to proceed.

## 2017-12-07 NOTE — H&P (Signed)
[facility] - [place] at [facility]   PATIENT NAME: Taylor Stephenson    MR#:  [id number]  DATE OF BIRTH:  1945-08-28  DATE OF ADMISSION:  Dec 05, 2017  PRIMARY CARE PHYSICIAN: Dellia Cloud, MD   REQUESTING/REFERRING PHYSICIAN: Manson Passey  CHIEF COMPLAINT:   Chief Complaint  Patient presents with  . Cardiac Arrest    HISTORY OF PRESENT ILLNESS: Ranell Skibinski  is a 72 y.o. male with a known history of aortic valve disease, CKD, CHF, diabetes, hypertension, obstructive sleep apnea on CPAP-was noted to have hypoglycemia at home yesterday evening by his wife up to 36, given peanut butter sandwich and responded with blood sugar rising up to 200.  With that, wife was watching on him more closely overnight.  He woke up to go to the bathroom around 2 AM.  He was complaining of feeling very weak and walking with support in the night.  After that he sat in the recliner and complained of some cramping in the legs.  Wife also checked his blood sugar at that time and it was more than 200.  Around 430 while sitting in the recliner he slowly slumped over, wife watched him and try to wake him up but he did not respond. She called ambulance, they have advised her to bring him to the floor and start doing CPR which she started until EMT arrived.  They continue doing CPR in route to hospital and after receiving 3 injections epinephrine, also noted to have some abnormalities on EKG so ER physician gave calcium chloride injections twice and also give bicarbonate injection. Finally patient had spontaneous restoration of circulation, with hypotension so started on vasopressors drip in the ER and intubated. I was called in to admit the patient because of status post cardiac arrest and resuscitation. His initial lab on arrival to ER noted to have severe hyperkalemia and acute renal failure.  PAST MEDICAL HISTORY:   Past Medical History:  Diagnosis Date  . Aortic valve disease    a. s/p  bioprosthetic AVR in ~ 2014  . CHF (congestive heart failure) (H[facility])   . CKD (chronic kidney disease), stage II   . Diabetes mellitus with complication ([facility])   . Hypertension   . Morbid obesity ([facility])   . OSA (obstructive sleep apnea)    a. on CPAP    PAST SURGICAL HISTORY:  Past Surgical History:  Procedure Laterality Date  . AORTA SURGERY    . APPENDECTOMY    . CARDIAC SURGERY     heart valve replaced    SOCIAL HISTORY:  Social History   Tobacco Use  . Smoking status: Former Smoker    Types: Cigarettes  . Smokeless tobacco: Never Used  Substance Use Topics  . Alcohol use: Not on file    FAMILY HISTORY:  Family History  Problem Relation Age of Onset  . Diabetes Mother   . Diabetes Father   . Kidney disease Father   . Diabetes Sister     DRUG ALLERGIES:  Allergies  Allergen Reactions  . Latex     REVIEW OF SYSTEMS:   Patient is intubated and not able to give review of system.  MEDICATIONS AT HOME:  Prior to Admission medications   Medication Sig Start Date End Date Taking? Authorizing Provider  acetaminophen (TYLENOL) 500 MG tablet Take 500-1,000 mg by mouth every 6 (six) hours as needed.   Yes [provider]  acidophilus (RISAQUAD) CAPS capsule Take 1 capsule by mouth daily.  Yes [provider]  albuterol (PROVENTIL HFA;VENTOLIN HFA) 108 (90 Base) MCG/ACT inhaler Inhale 2 puffs into the lungs every 6 (six) hours as needed for wheezing or shortness of breath.   Yes [provider]  ammonium lactate (LAC-HYDRIN) 12 % lotion Apply 1 application topically as needed for dry skin.   Yes [provider]  aspirin EC 81 MG tablet Take 81 mg by mouth daily.   Yes [provider]  atorvastatin (LIPITOR) 80 MG tablet Take 40 mg by mouth daily. 10/02/17  Yes [provider]  carvedilol (COREG) 12.5 MG tablet Take 12.5 mg by mouth 2 (two) times daily with a meal.   Yes [provider]  insulin glargine  (LANTUS) 100 UNIT/ML injection Inject 40 Units into the skin 2 (two) times daily.    Yes [provider]  insulin regular (NOVOLIN R,HUMULIN R) 100 units/mL injection Inject 20 Units into the skin 3 (three) times daily before meals. If blood sugar is over 125.   Yes [provider]  lisinopril (PRINIVIL,ZESTRIL) 20 MG tablet Take 30 mg by mouth daily. 10/02/17  Yes [provider]  omeprazole (PRILOSEC) 20 MG capsule Take 20 mg by mouth daily.   Yes [provider]  sertraline (ZOLOFT) 50 MG tablet Take 1 tablet by mouth daily.    Yes [provider]  tamsulosin (FLOMAX) 0.4 MG CAPS capsule Take 0.4 mg by mouth daily.   Yes [provider]  Tiotropium Bromide-Olodaterol 2.5-2.5 MCG/ACT AERS Inhale 2 puffs into the lungs 2 (two) times daily.    Yes [provider]  torsemide (DEMADEX) 20 MG tablet Take 2 tablets (40 mg total) by mouth daily. 09/24/17  Yes Mayo, Allyn Kenner, MD  glucagon (GLUCAGON EMERGENCY) 1 MG injection Inject 1 mg into the vein once as needed (emergency hypoglycemia).    [provider]  potassium chloride SA (K-DUR,KLOR-CON) 20 MEQ tablet Take 1 tablet (20 mEq total) by mouth daily. Patient not taking: Reported on 12/06/17 07/19/17   Enedina Finner, MD      PHYSICAL EXAMINATION:   VITAL SIGNS: Blood pressure (!) 77/36, pulse 86, temperature (!) 96.8 F (36 C), temperature source Core (Comment), resp. rate 16, weight 127.4 kg, SpO2 95 %.  GENERAL:  72 y.o.-year-old patient lying in the bed with critical illness.  EYES: Pupils equal, dilated and not reactive to light , No scleral icterus. Extraocular muscles not checked as patient is not responsive.  HEENT: Head atraumatic, normocephalic. Oropharynx and nasopharynx clear.  NECK:  Supple, no jugular venous distention. No thyroid enlargement, no tenderness.  LUNGS: Normal breath sounds bilaterally, no wheezing, rales,rhonchi or crepitation. No use of accessory  muscles of respiration.  CARDIOVASCULAR: S1, S2 normal. No murmurs, rubs, or gallops.  ABDOMEN: Soft, nontender, nondistended. Bowel sounds present. No organomegaly or mass.  EXTREMITIES: No pedal edema, cyanosis, or clubbing.  NEUROLOGIC: Patient is status post cardiac arrest and not responsive to any stimuli. PSYCHIATRIC: The patient is status post cardiac arrest and comatose.  SKIN: No obvious rash, lesion, or ulcer.   LABORATORY PANEL:   CBC Recent Labs  Lab December 06, 2017 0612  WBC 13.1*  HGB 10.3*  HCT 32.4*  PLT 129*  MCV 93.2  MCH 29.6  MCHC 31.8*  RDW 19.9*   ------------------------------------------------------------------------------------------------------------------  Chemistries  Recent Labs  Lab December 06, 2017 0612 12/06/17 0919  NA 136 136  133*  K 6.7* 6.0*  5.9*  CL 101 103  101  CO2 19* 20*  20*  GLUCOSE 335* 315*  313*  BUN 69* 75*  75*  CREATININE 2.68* 2.80*  2.69*  CALCIUM 8.2* 9.3  9.1  MG  --  2.5*  AST  --  165*  ALT  --  48*  ALKPHOS  --  190*  BILITOT  --  1.7*   ------------------------------------------------------------------------------------------------------------------ estimated creatinine clearance is 31.3 mL/min (A) (by C-G formula based on SCr of 2.69 mg/dL (H)). ------------------------------------------------------------------------------------------------------------------ No results for input(s): TSH, T4TOTAL, T3FREE, THYROIDAB in the last 72 hours.  Invalid input(s): FREET3   Coagulation profile Recent Labs  Lab November 29, 2017 0919  INR 1.45   ------------------------------------------------------------------------------------------------------------------- No results for input(s): DDIMER in the last 72 hours. -------------------------------------------------------------------------------------------------------------------  Cardiac Enzymes Recent Labs  Lab 2017/11/29 0612 11-29-2017 0919  TROPONINI 0.07* 4.53*    ------------------------------------------------------------------------------------------------------------------ Invalid input(s): POCBNP  ---------------------------------------------------------------------------------------------------------------  Urinalysis    Component Value Date/Time   COLORURINE YELLOW (A) 09/27/2015 1703   APPEARANCEUR CLEAR (A) 09/27/2015 1703   LABSPEC 1.013 09/27/2015 1703   PHURINE 5.0 09/27/2015 1703   GLUCOSEU >500 (A) 09/27/2015 1703   HGBUR NEGATIVE 09/27/2015 1703   BILIRUBINUR NEGATIVE 09/27/2015 1703   KETONESUR NEGATIVE 09/27/2015 1703   PROTEINUR 30 (A) 09/27/2015 1703   NITRITE NEGATIVE 09/27/2015 1703   LEUKOCYTESUR NEGATIVE 09/27/2015 1703     RADIOLOGY: Ct Head Wo Contrast  Result Date: 11/29/17 CLINICAL DATA:  72 year old male status post cardiac arrest and resuscitation EXAM: CT HEAD WITHOUT CONTRAST TECHNIQUE: Contiguous axial images were obtained from the base of the skull through the vertex without intravenous contrast. COMPARISON:  None. FINDINGS: Brain: No evidence of acute infarction, hemorrhage, hydrocephalus, extra-axial collection or mass lesion/mass effect. Vascular: Calcifications are present in both cavernous carotid arteries. Skull: Normal. Negative for fracture or focal lesion. Sinuses/Orbits: No acute finding. Other: None. IMPRESSION: No acute intracranial abnormality. Specifically, no evidence of acute intracranial hemorrhage. Electronically Signed   By: Malachy Moan M.D.   On: 11/29/2017 09:00   Dg Chest Port 1 View  Result Date: Nov 29, 2017 CLINICAL DATA:  72 year old male with cardiac arrest. EXAM: PORTABLE CHEST 1 VIEW COMPARISON:  Chest radiograph dated 09/20/2017 FINDINGS: Endotracheal tube with tip at the level of the right fifth costovertebral junction, likely at or above the level of the carina. Recommend retraction by approximately 2-3 cm for optimal positioning. There is shallow inspiration with bibasilar  atelectasis. No focal consolidation, pleural effusion, or pneumothorax. Stable cardiomegaly. Median sternotomy wires and mechanical heart valve. Atherosclerotic calcification of the aortic arch. No acute osseous pathology. IMPRESSION: 1. Endotracheal tube at or just above the carina. Recommend retraction by approximately 2-3 cm for optimal positioning. 2. No acute cardiopulmonary process. Electronically Signed   By: Elgie Collard M.D.   On: 2017/11/29 06:51   Dg Abd Portable 1v  Result Date: 11/29/17 CLINICAL DATA:  NG tube placement. EXAM: PORTABLE ABDOMEN - 1 VIEW COMPARISON:  Chest x-ray 29-Nov-2017. FINDINGS: NG tube noted with its tip over the distal stomach. Gastric distention noted. Dilated of bowel noted over the right abdomen. Prior median sternotomy cardiac valve replacement. IMPRESSION: 1. NG tube noted with its tip over the distal stomach. Gastric distention. 2.  Dilated loops of bowel noted over the right abdomen. Electronically Signed   By: Maisie Fus  Register   On: 2017/11/29 11:27    EKG: Orders placed or performed during the hospital encounter of 29-Nov-2017  . EKG 12-Lead  . EKG 12-Lead  . ED EKG  . ED EKG  . EKG 12-Lead  . EKG 12-Lead  .  EKG 12-Lead    IMPRESSION AND PLAN:  *Status post cardiac arrest and resuscitation Most likely secondary to renal failure and hyperkalemia Patient already have dilated pupils and very high chances of having an anoxic brain injury. These findings and possibilities are discussed with patient's son and wife in the room at the time of admission. Further management plans to be discussed by ICU physician with family.  *Acute on chronic renal insufficiency Worsening in the renal function compared to past. Nephrology consult.  *Cardiogenic shock Continue vasopressor support.  *Hypokalemia Bicarbonate drip IV, avoid nephrotoxic medications, management per nephrology, may need CRRT, if family want to continue aggressive  management.  *Hypertension Hold medications for now.  *Diabetes Patient had episodes of hypoglycemia last evening and he has acute on chronic renal failure so currently would hold antidiabetic medications.  *Chronic systolic congestive heart failure Currently no exacerbation symptoms, will continue to monitor.  All the records are reviewed and case discussed with ED provider. Management plans discussed with the patient, family and they are in agreement.  CODE STATUS: Full.    Code Status Orders  (From admission, onward)         Start     Ordered   21-Nov-2017 1432  Limited resuscitation (code)  Continuous    Question Answer Comment  In the event of cardiac or respiratory ARREST: Initiate Code Blue, Call Rapid Response No   In the event of cardiac or respiratory ARREST: Perform CPR No   In the event of cardiac or respiratory ARREST: Perform Intubation/Mechanical Ventilation Yes   In the event of cardiac or respiratory ARREST: Use NIPPV/BiPAp only if indicated Yes   In the event of cardiac or respiratory ARREST: Administer ACLS medications if indicated No   In the event of cardiac or respiratory ARREST: Perform Defibrillation or Cardioversion if indicated No   Comments as discussed with the family (wife and son) at the bedside.  They requested the patient to continue on the ventilator, continue with current management, no heroic measures no renal replacement therapy and to discontinue hypothermia.      2017-11-21 1448        Code Status History    Date Active Date Inactive Code Status Order ID Comments User Context   11-21-17 1107 November 21, 2017 1448 Full Code [id number]  Dalbert Garnet, RN Inpatient   09/21/2017 0436 09/24/2017 1836 Full Code [id number]  Arnaldo Natal, MD Inpatient   07/14/2017 1520 07/18/2017 1928 Full Code [id number]  Shaune Pollack, MD ED       TOTAL TIME TAKING CARE OF THIS PATIENT: 60 critical care minutes.    Altamese Dilling M.D on 11/21/2017   Between 7am  to 6pm - Pager - [phone]  After 6pm go to [email or web address] - password [email or web address]  [facility] [place] [facility]  Office  [phone]  CC: Primary care physician; Dellia Cloud, MD   Note: This dictation was prepared with Dragon dictation along with smaller phrase technology. Any transcriptional errors that result from this process are unintentional.

## 2017-12-07 NOTE — Procedures (Signed)
Arterial Catheter Insertion Procedure Note Taylor Stephenson [id number] [birth year]  Procedure: Insertion of Arterial Catheter  Indications: Blood pressure monitoring and Frequent blood sampling  Procedure Details Consent: Risks of procedure as well as the alternatives and risks of each were explained to the (patient/caregiver).  Consent for procedure obtained. Time Out: Verified patient identification, verified procedure, site/side was marked, verified correct patient position, special equipment/implants available, medications/allergies/relevent history reviewed, required imaging and test results available.  Performed  Maximum sterile technique was used including antiseptics, cap, gloves, gown, hand hygiene, mask and sheet. Skin prep: Chlorhexidine; local anesthetic administered 20 gauge catheter was inserted into right femoral artery using the Seldinger technique. ULTRASOUND GUIDANCE USED: NO Evaluation Blood flow good; BP tracing good. Complications: No apparent complications.   Taylor Stephenson 2017-12-01

## 2017-12-07 NOTE — Consult Note (Signed)
Cardiology Consultation:   Patient ID: Taylor Stephenson; [id number]; 1945/06/12   Admit date: Nov 23, 2017 Date of Consult: 11/23/2017  Primary Care Provider: John Giovanni, MD [facility]   Patient Profile:   Taylor Stephenson is a 72 y.o. 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 Soyla Murphy.  History of Present Illness:   Taylor Stephenson is followed by the [facility]. He reports undergoing bioprosthetic AVR in 2014 with a LHC prior to that showing nonobstructive disease. We do not have any prior cardiac records for him for review. He was admitted to the [facility] in the winter of 2019 with acute on chronic diastolic CHF requiring IV diuresis. He was admitted to [facility] in 07/2017 with acute on chronic diastolic CHF. Echo at that time showed EF 55-60%, no RWMA, Gr2DD, mild AS with mild AI, mild MR, mildly dilated LA, moderately dilated RV with moderately reduced RVSF, unable to estimate PASP. He diuresed ~ 16 L and had a discharge weight of 302 pounds.  He was admitted to [facility] in mid 09/2017 with acute on chronic diastolic CHF with weight gain of approximately 12 pounds over the prior 3 to 4 days leading up to his admission.  Admission weight 307 pounds.  He underwent IV diuresis with a bump in his serum creatinine leading to diuretic holiday.  Discharge weight of 293 pounds.  He was discharged on torsemide 40 mg daily, KCl 20 mEq daily, lisinopril 30 mg daily, carvedilol 12.5 mg twice daily, along with his noncardiac medications.  Discharge potassium of 4.0, serum creatinine 1.52.  He was seen by the [facility] CHF clinic  on 8/1 in hospital follow-up with a BP of 114/37, weight 279.3 pounds.  No changes were made.  Patient reportedly suffered a witnessed cardiopulmonary arrest this morning at approximately 430.  ED note indicates patient's wife began chest compressions and called EMS.  Upon EMS arrival he was noted to be in asystole.  He received chest compressions, 3 amps of epinephrine, 1 dose of atropine, 1 dose of sodium bicarbonate with ROSC.  While in route, patient suffered a second cardiac arrest with ROSC being obtained in the emergency room.  Patient has remained intubated.  Blood pressure has been soft as low with the 70s over 30s requiring multiple vasopressors including vasopressin, levophed, and dopamine with blood pressures remaining soft in the 80s over 30s.  At time of cardiology consult ICU staff is preparing to hang epinephrine.  He is not under full cooling protocol given his persistent hypotension on multiple vasopressors.  He is not on addition given his significant hypotension.  He is completely nonresponsive.  Initial troponin noted to be 0.07 trending up to 4.53.  Stat CT head was without acute intracranial process.  Initial potassium noted to be 6.7-->5.9-->6.0.  Serum creatinine 2.68 trending to 2.80.  Albumin 2.9.  AST 165, ALT 48, alk phos 190, T bili 1.7.  Glucose 315.  CK 355.  INR 1.45.  Phosphorus 9.2.  Currently he remains a full code.  Note was obtained from prior documentation given patient is intubated and there is no family present.  Past Medical History:  Diagnosis Date  . Aortic valve disease    a. s/p bioprosthetic AVR in ~ 2014  . CHF (congestive heart failure) ([place])   . CKD (chronic kidney disease), stage II   . Diabetes mellitus with complication ([place])   . Hypertension   . Morbid obesity ([place])   . OSA (obstructive sleep apnea)    a. on CPAP    Past Surgical History:  Procedure Laterality Date  . AORTA SURGERY    . APPENDECTOMY    . CARDIAC SURGERY     heart valve  replaced     Home Meds: Prior to Admission medications   Medication Sig Start Date End Date Taking? Authorizing Provider  acetaminophen (TYLENOL) 500 MG tablet Take 500-1,000 mg by mouth every 6 (six) hours as needed.   Yes [provider]  acidophilus (RISAQUAD) CAPS capsule Take 1 capsule by mouth daily.    Yes [provider]  albuterol (PROVENTIL HFA;VENTOLIN HFA) 108 (90 Base) MCG/ACT inhaler Inhale 2 puffs into the lungs every 6 (six) hours as needed for wheezing or shortness of breath.   Yes [provider]  ammonium lactate (LAC-HYDRIN) 12 % lotion Apply 1 application topically as needed for dry skin.   Yes [provider]  aspirin EC 81 MG tablet Take 81 mg by mouth daily.   Yes [provider]  atorvastatin (LIPITOR) 80 MG tablet Take 40 mg by mouth daily. 10/02/17  Yes [provider]  carvedilol (COREG) 12.5 MG tablet Take 12.5 mg by mouth 2 (two) times daily with a meal.   Yes [provider]  insulin glargine (LANTUS) 100 UNIT/ML injection Inject 40 Units into the skin 2 (two) times daily.    Yes [provider]  insulin regular (NOVOLIN R,HUMULIN R) 100 units/mL injection Inject 20 Units into the skin 3 (three) times daily before meals. If blood sugar is over 125.   Yes [provider]  lisinopril (PRINIVIL,ZESTRIL) 20 MG tablet Take 30 mg by mouth daily. 10/02/17  Yes [provider]  omeprazole (PRILOSEC) 20 MG capsule Take 20 mg by mouth daily.   Yes [provider]  sertraline (ZOLOFT) 50 MG tablet Take 1 tablet by mouth daily.    Yes [provider]  tamsulosin (FLOMAX) 0.4 MG CAPS capsule Take 0.4 mg by mouth daily.   Yes [provider]  Tiotropium Bromide-Olodaterol 2.5-2.5 MCG/ACT AERS Inhale 2 puffs into the lungs 2 (two) times daily.    Yes [provider]  torsemide (DEMADEX) 20 MG tablet Take 2 tablets (40 mg total) by mouth daily. 09/24/17  Yes  Mayo, Pete Pelt, MD  glucagon (GLUCAGON EMERGENCY) 1 MG injection Inject 1 mg into the vein once as needed (emergency hypoglycemia).    [provider]  potassium chloride SA (K-DUR,KLOR-CON) 20 MEQ tablet Take 1 tablet (20 mEq total) by mouth daily. Patient not taking: Reported on Dec 02, 2017 07/19/17   Fritzi Mandes, MD    Inpatient Medications: Scheduled Meds: . heparin  5,000 Units Subcutaneous Q8H   Continuous Infusions: . DOPamine    . DOPamine    . epinephrine    . famotidine (PEPCID) IV    . fentaNYL infusion INTRAVENOUS 100 mcg/hr (12/02/2017 0940)  . insulin (NOVOLIN-R) infusion    . levETIRAcetam    . midazolam (VERSED) infusion    . norepinephrine (LEVOPHED) Adult infusion 30 mcg/min (Dec 02, 2017 1039)  . piperacillin-tazobactam (ZOSYN)  IV    .  sodium bicarbonate (isotonic) infusion in sterile water    .  sodium chloride    . vasopressin (PITRESSIN) infusion - *FOR SHOCK*     PRN Meds: dextrose, fentaNYL  Allergies:   Allergies  Allergen Reactions  . Latex     Social History:   Social History   Socioeconomic History  . Marital status: Married    Spouse name: Not on file  . Number of children: Not on file  . Years of education: Not on file  . Highest education level: Not on file  Occupational History  . Not on file  Social Needs  . Financial resource strain: Not on file  . Food insecurity:    Worry: Not on file    Inability: Not on file  . Transportation needs:    Medical: Not on file    Non-medical: Not on file  Tobacco Use  . Smoking status: Former Smoker    Types: Cigarettes  . Smokeless tobacco: Never Used  Substance and Sexual Activity  . Alcohol use: Not on file  . Drug use: Not on file  . Sexual activity: Not on file  Lifestyle  . Physical activity:    Days per week: Not on file    Minutes per session: Not on file  . Stress: Not on file  Relationships  . Social connections:    Talks on phone: Not on file    Gets together: Not on  file    Attends religious service: Not on file    Active member of club or organization: Not on file    Attends meetings of clubs or organizations: Not on file    Relationship status: Not on file  . Intimate partner violence:    Fear of current or ex partner: Not on file    Emotionally abused: Not on file    Physically abused: Not on file    Forced sexual activity: Not on file  Other Topics Concern  . Not on file  Social History Narrative  . Not on file     Family History:   Family History  Problem Relation Age of Onset  . Diabetes Mother   . Diabetes Father   . Kidney disease Father   . Diabetes Sister     ROS:  Review of Systems  Unable to perform ROS: Intubated      Physical Exam/Data:   Vitals:   11/15/2017 0742 November 15, 2017 0828 15-Nov-2017 0850 15-Nov-2017 0900  BP: (!) 103/41 (!) 105/38  (!) 100/44  Pulse: 75 74  77  Resp: 18 18  16   Temp: (!) 96.8 F (36 C) (!) 97 F (36.1 C)  (!) 97.5 F (36.4 C)  SpO2: 100% 100% 100% 100%  Weight:    127.4 kg   No intake or output data in the 24 hours ending 2017-11-15 1410 Filed Weights   11-15-17 0900  Weight: 127.4 kg   Body mass index is 43.99 kg/m.   Physical Exam: General: Critically ill-appearing, in no acute distress. Head: Normocephalic, atraumatic, equal bilateral pupillary dilatation, sclera non-icteric, no xanthomas, nares without discharge.  Neck: Negative for carotid bruits. JVD not elevated. Lungs: Diminished breath sounds bilaterally.  Intubated.Marland Kitchen Heart: RRR with S1 S2. No murmurs, rubs, or gallops appreciated. Abdomen: Soft, non-tender, non-distended with normoactive bowel sounds. No hepatomegaly. No rebound/guarding. No obvious abdominal masses. Msk:  Strength and tone appear normal for age. Extremities: Cool to the touch with mild cyanosis and trace bilateral pretibial edema.  Neuro: Intubated.  Cooled to 36 C. Psych: Intubated.   EKG:  The EKG was  personally reviewed and demonstrates: Possible A. fib,  RBBB Telemetry:  Telemetry was personally reviewed and demonstrates: Possible A. fib versus prolonged first-degree AV block, BBB, 80s bpm  Weights: Filed Weights   12/07/2017 0900  Weight: 127.4 kg    Relevant CV Studies:  2-D echo 07/2017: Study Conclusions  - Left ventricle: The cavity size was normal. There was moderate   concentric hypertrophy. Systolic function was normal. The   estimated ejection fraction was in the range of 55% to 60%. Wall   motion was normal; there were no regional wall motion   abnormalities. Features are consistent with a pseudonormal left   ventricular filling pattern, with concomitant abnormal relaxation   and increased filling pressure (grade 2 diastolic dysfunction). - Aortic valve: There was very mild stenosis. There was mild   regurgitation. - Mitral valve: Calcified annulus. Mildly thickened leaflets .   There was mild regurgitation. - Left atrium: The atrium was mildly dilated. - Right ventricle: The cavity size was moderately dilated. Wall   thickness was normal. Systolic function was moderately reduced. - Pulmonary arteries: Systolic pressure could not be accurately   estimated. __________  Laboratory Data:  Chemistry Recent Labs  Lab 12/07/17 0612 Dec 07, 2017 0919  NA 136 136  133*  K 6.7* 6.0*  5.9*  CL 101 103  101  CO2 19* 20*  20*  GLUCOSE 335* 315*  313*  BUN 69* 75*  75*  CREATININE 2.68* 2.80*  2.69*  CALCIUM 8.2* 9.3  9.1  GFRNONAA 22* 21*  22*  GFRAA 26* 24*  25*  ANIONGAP 16* 13  12    Recent Labs  Lab 12-07-2017 0919  PROT 8.3*  ALBUMIN 2.9*  AST 165*  ALT 48*  ALKPHOS 190*  BILITOT 1.7*   Hematology Recent Labs  Lab 12/07/17 0612  WBC 13.1*  RBC 3.48*  HGB 10.3*  HCT 32.4*  MCV 93.2  MCH 29.6  MCHC 31.8*  RDW 19.9*  PLT 129*   Cardiac Enzymes Recent Labs  Lab 12/07/2017 0612 2017/12/07 0919  TROPONINI 0.07* 4.53*   No results for input(s): TROPIPOC in the last 168 hours.  BNPNo results  for input(s): BNP, PROBNP in the last 168 hours.  DDimer No results for input(s): DDIMER in the last 168 hours.  Radiology/Studies:  Ct Head Wo Contrast  Result Date: 12/07/2017 IMPRESSION: No acute intracranial abnormality. Specifically, no evidence of acute intracranial hemorrhage. Electronically Signed   By: Jacqulynn Cadet M.D.   On: 07-Dec-2017 09:00   Dg Chest Port 1 View  Result Date: December 07, 2017 IMPRESSION: 1. Endotracheal tube at or just above the carina. Recommend retraction by approximately 2-3 cm for optimal positioning. 2. No acute cardiopulmonary process. Electronically Signed   By: Anner Crete M.D.   On: 12-07-17 06:51   Dg Abd Portable 1v  Result Date: 2017/12/07 IMPRESSION: 1. NG tube noted with its tip over the distal stomach. Gastric distention. 2.  Dilated loops of bowel noted over the right abdomen. Electronically Signed   By: Marcello Moores  Register   On: 12-07-17 11:27    Assessment and Plan:   1.  Cardiopulmonary arrest: -Remains on ventilator support, wean as/if able -Supportive care -As detailed below  2.  Non-STEMI: -Troponin has currently trended to 4.53.  Continue to cycle until plateau and peak -Check stat echocardiogram to evaluate LV systolic function and for wall motion abnormalities -Patient is not currently stable enough for emergent cardiac catheterization -He will need cardiac catheterization should there be meaningful  neurologic recovery -Not currently on heparin infusion given concern for cerebral edema/bleed  3.  Cardiogenic shock: -Currently requiring vasopressin, levophed, dopamine, and epinephrine with pressures remaining in the 80s over 30s -Check stat echo as above -Too unstable for emergent cardiac catheterization as above -Currently his carvedilol, torsemide, and lisinopril are held -Overall poor prognosis  4.  Hyperkalemia: -Improving -Nephrology is following -Currently receiving sodium bicarb -If hyperkalemia persists or  worsens he may need emergent hemodialysis, defer to nephrology  5.  Possible A. Fib: -Cannot exclude A. fib versus sinus rhythm with prolonged first-degree AV block -Nonetheless, patient is not a candidate for full dose anticoagulation at this time given concerns for possible cerebral edema/bleed -Continue to monitor  6. HFpEF: -Check echo as above  7.  Status post bioprosthetic AVR: -Check echo as above  8.  Multiorgan failure including acute renal failure in the setting of underlying CKD stage III baseline creatinine 1.5, anoxic brain injury, and liver failure: -Nephrology on board -There were failure felt to be secondary to ATN from hypoperfusion during his cardiac arrest versus acute on chronic injury prior to the above events -Cannot exclude shock liver -Supportive care -Trend -Downtime of at least 40 minutes per ICU staff -Neurology to see  9.  Morbid obesity/OSA/possible OHS: -Requiring ventilatory support as above  10.  IDDM: -Per IM  11.  Anemia of chronic disease: -Stable  Dispo: -Overall, poor prognosis -Recommend palliative care consult to discuss goals of care with family   For questions or updates, please contact [facility] Please consult [email or web address] for contact info under Cardiology/STEMI.   Signed, Christell Faith, PA-C [place] Pager: [phone] 08-Dec-2017, 2:10 PM

## 2017-12-07 NOTE — Progress Notes (Signed)
Inpatient Diabetes Program Recommendations  AACE/ADA: New Consensus Statement on Inpatient Glycemic Control (2019)  Target Ranges:  Prepandial:   less than 140 mg/dL      Peak postprandial:   less than 180 mg/dL (1-2 hours)      Critically ill patients:  140 - 180 mg/dL   Results for Taylor Stephenson, Taylor Stephenson (MRN [id number]) as of 12/09/17 10:16  Ref. Range 2017/12/09 06:11 December 09, 2017 08:37 Dec 09, 2017 09:11  Glucose-Capillary Latest Ref Range: 70 - 99 mg/dL 924 (H) 268 (H) 341 (H)   Review of Glycemic Control  Diabetes history: DM2 Outpatient Diabetes medications: Lantus 40 units BID, Regular 20 units TID with meals (if CBG >125 mg/dl) Current orders for Inpatient glycemic control: None  Inpatient Diabetes Program Recommendations:  Correction (SSI): Please consider ordering ICU glycemic control order set with CBGs and Novolog correction.  Thanks, Orlando Penner, RN, MSN, CDE Diabetes Coordinator Inpatient Diabetes Program [phone] (Team Pager from 8am to 5pm)

## 2017-12-07 NOTE — Progress Notes (Signed)
Notified Dr. Duanne Limerick of Map in 40's. See new orders.

## 2017-12-07 NOTE — Death Summary Note (Signed)
DEATH SUMMARY   Patient Details  Name: Taylor Stephenson MRN: [id number] DOB: 1945-08-14  Admission/Discharge Information   Admit Date:  11/20/2017  Date of Death:  11/20/17  Time of Death:  2325-07-25  Length of Stay: 0  Referring Physician: Dellia Cloud, MD   Reason(s) for Hospitalization  Cardiac Arrest   Diagnoses  Preliminary cause of death: Cardiac arrest [facility]) Secondary Diagnoses (including complications and co-morbidities):  Principal Problem:   Cardiac arrest [facility]) Active Problems:   Pressure injury of skin   Acute respiratory failure secondary atelectasis and pneumonia with possible aspiration    NSTEMI     HTN   Chronic diastolic CHF   Acute on chronic renal failure    Metabolic acidosis with hyperkalemia    Lactic acidosis    Acute ischemic hepatitis secondary to cardiogenic shock    Anemia of chronic disease    AVR with a bioprosthetic valve    COPD   Cardiogenic shock    Morbid Obesity    Uncontrolled Diabetes Mellitus    Acute encephalopathy and Seizure Activity likely secondary to anoxic brain injury   Brief Hospital Course (including significant findings, care, treatment, and services provided and events leading to death)  Taylor Stephenson is a 72 y.o. year old male who presented to [facility] ER via EMS following a witnessed cardiac arrest at his residence the morning of 11-20-2017. Per ER notes EMS reported pts wife performed CPR prior to their arrival, initial cardiac rhythm asystole EMS administered several amps of epinephrine, atropine, and sodium bicarb prior to ROSC.  En route to the ER he had a second cardiac arrest, ACLS protocol initiated prior to ROSC.  Upon arrival to the ER he required mechanical intubation due to acute respiratory failure and encephalopathy.  It was also noted the pt was hypotensive requiring multiple vasopressors. He was subsequently admitted to ICU for further workup and treatment. In ICU the pt had possible seizure activity, therefore  anticonvulsant medication initiated.  He also remained unresponsive to verbal or painful stimulation and lab results revealed acute on chronic renal failure. Pts wife declined hypothermic protocol or any form of dialysis.  On 11-20-2017 ICU intensivist spoke with pts wife and family members regarding rapid decline in pts condition.  Therefore, once all family arrived at bedside pts wife, son, and additional family members decided to change pts code status to DNR and transition pt to comfort measure only.  The pt expired on 2017/11/20 at 2325-07-25 with family at bedside.  Pertinent Labs and Studies  Significant Diagnostic Studies Ct Head Wo Contrast  Result Date: 11-20-17 CLINICAL DATA:  72 year old male status post cardiac arrest and resuscitation EXAM: CT HEAD WITHOUT CONTRAST TECHNIQUE: Contiguous axial images were obtained from the base of the skull through the vertex without intravenous contrast. COMPARISON:  None. FINDINGS: Brain: No evidence of acute infarction, hemorrhage, hydrocephalus, extra-axial collection or mass lesion/mass effect. Vascular: Calcifications are present in both cavernous carotid arteries. Skull: Normal. Negative for fracture or focal lesion. Sinuses/Orbits: No acute finding. Other: None. IMPRESSION: No acute intracranial abnormality. Specifically, no evidence of acute intracranial hemorrhage. Electronically Signed   By: Malachy Moan M.D.   On: 11-20-2017 09:00   Dg Chest Port 1 View  Result Date: November 20, 2017 CLINICAL DATA:  72 year old male with cardiac arrest. EXAM: PORTABLE CHEST 1 VIEW COMPARISON:  Chest radiograph dated 09/20/2017 FINDINGS: Endotracheal tube with tip at the level of the right fifth costovertebral junction, likely at or above the level of the carina. Recommend  retraction by approximately 2-3 cm for optimal positioning. There is shallow inspiration with bibasilar atelectasis. No focal consolidation, pleural effusion, or pneumothorax. Stable cardiomegaly. Median  sternotomy wires and mechanical heart valve. Atherosclerotic calcification of the aortic arch. No acute osseous pathology. IMPRESSION: 1. Endotracheal tube at or just above the carina. Recommend retraction by approximately 2-3 cm for optimal positioning. 2. No acute cardiopulmonary process. Electronically Signed   By: Elgie Collard M.D.   On: 11-28-17 06:51   Dg Abd Portable 1v  Result Date: November 28, 2017 CLINICAL DATA:  NG tube placement. EXAM: PORTABLE ABDOMEN - 1 VIEW COMPARISON:  Chest x-ray 11-28-2017. FINDINGS: NG tube noted with its tip over the distal stomach. Gastric distention noted. Dilated of bowel noted over the right abdomen. Prior median sternotomy cardiac valve replacement. IMPRESSION: 1. NG tube noted with its tip over the distal stomach. Gastric distention. 2.  Dilated loops of bowel noted over the right abdomen. Electronically Signed   By: Maisie Fus  Register   On: 11/28/2017 11:27    Microbiology Recent Results (from the past 240 hour(s))  MRSA PCR Screening     Status: None   Collection Time: 2017-11-28  9:20 AM  Result Value Ref Range Status   MRSA by PCR NEGATIVE NEGATIVE Final    Comment:        The GeneXpert MRSA Assay (FDA approved for NASAL specimens only), is one component of a comprehensive MRSA colonization surveillance program. It is not intended to diagnose MRSA infection nor to guide or monitor treatment for MRSA infections. Performed at [facility], [address] [place], [place] [address]     Lab Basic Metabolic Panel: Recent Labs  Lab 2017-11-28 0612 28-Nov-2017 0919 November 28, 2017 0920 2017/11/28 1539  NA 136 136  133*  --  138  K 6.7* 6.0*  5.9*  --  4.5  CL 101 103  101  --  108  CO2 19* 20*  20*  --  22  GLUCOSE 335* 315*  313*  --  117*  BUN 69* 75*  75*  --  74*  CREATININE 2.68* 2.80*  2.69*  --  2.99*  CALCIUM 8.2* 9.3  9.1  --  8.0*  MG  --  2.5*  --   --   PHOS  --   --  9.2*  --    Liver Function Tests: Recent Labs   Lab 2017/11/28 0919  AST 165*  ALT 48*  ALKPHOS 190*  BILITOT 1.7*  PROT 8.3*  ALBUMIN 2.9*   No results for input(s): LIPASE, AMYLASE in the last 168 hours. No results for input(s): AMMONIA in the last 168 hours. CBC: Recent Labs  Lab 11/28/2017 0612  WBC 13.1*  HGB 10.3*  HCT 32.4*  MCV 93.2  PLT 129*   Cardiac Enzymes: Recent Labs  Lab 2017-11-28 0612 2017/11/28 0919 11/28/2017 1539  CKTOTAL  --  355  --   TROPONINI 0.07* 4.53* 10.65*   Sepsis Labs: Recent Labs  Lab 11-28-2017 0612 11-28-2017 0919 2017/11/28 0920 2017/11/28 1539  PROCALCITON  --  5.15  --   --   WBC 13.1*  --   --   --   LATICACIDVEN  --   --  5.3* 5.5*    Procedures/Operations  Mechanical Intubation  Left femoral CVL placement  Right femoral arterial line placement   Sonda Rumble, AGNP  Pulmonary/Critical Care Pager [phone] (please enter 7 digits) PCCM Consult Pager [phone] (please enter 7 digits)

## 2017-12-07 NOTE — Progress Notes (Signed)
Pharmacy Antibiotic Note  Taylor Stephenson is a 72 y.o. male admitted on 11-10-17 with cardiac arrest at home. Pharmacy has been consulted for Zosyn dosing for possible aspiration pneumonia.   Plan: Will initiate Zosyn EI 3.375g IV Q8hr.   Weight: 280 lb 13.9 oz (127.4 kg)  Temp (24hrs), Avg:97 F (36.1 C), Min:95.2 F (35.1 C), Max:97.7 F (36.5 C)  Recent Labs  Lab 10-Nov-2017 0612 11-10-17 0919 November 10, 2017 0920 November 10, 2017 1539  WBC 13.1*  --   --   --   CREATININE 2.68* 2.80*  2.69*  --   --   LATICACIDVEN  --   --  5.3* 5.5*    Estimated Creatinine Clearance: 31.3 mL/min (A) (by C-G formula based on SCr of 2.69 mg/dL (H)).    Allergies  Allergen Reactions  . Latex     Antimicrobials this admission: Zosyn 9/3 >>   Dose adjustments this admission: N/A  Microbiology results: 9/3 MRSA PCR: negative   Thank you for allowing pharmacy to be a part of this patient's care.  Simpson,Michael L Nov 10, 2017 5:09 PM

## 2017-12-07 NOTE — Procedures (Signed)
Central Venous Catheter Insertion Procedure Note Kaushal Dirico [id number] [birth year]  Procedure: Insertion of Central Venous Catheter Indications: Drug and/or fluid administration and Frequent blood sampling  Procedure Details Consent: Risks of procedure as well as the alternatives and risks of each were explained to the (patient/caregiver).  Consent for procedure obtained. Time Out: Verified patient identification, verified procedure, site/side was marked, verified correct patient position, special equipment/implants available, medications/allergies/relevent history reviewed, required imaging and test results available.  Performed  Maximum sterile technique was used including antiseptics, cap, gloves, gown, hand hygiene, mask and sheet. Skin prep: Chlorhexidine; local anesthetic administered A antimicrobial bonded/coated triple lumen catheter was placed in the left femoral vein due to patient not tolerating Trendelenburg position using the Seldinger technique.  Evaluation Blood flow good Complications: No apparent complications Patient did tolerate procedure well.   Kellen Hover 2017/11/26, 1:55 PM

## 2017-12-07 DEATH — deceased

## 2018-01-12 ENCOUNTER — Ambulatory Visit: Payer: Medicare Other | Admitting: Family

## 2018-09-19 IMAGING — CT CT HEAD W/O CM
2 of 3 series · 15 of 37 positions shown, 18 images · non-contrast
Comparison: None.

CLINICAL DATA: 72-year-old male status post cardiac arrest and
resuscitation

EXAM:
CT HEAD WITHOUT CONTRAST
TECHNIQUE: Contiguous axial images were obtained from the base of the skull
through the vertex without intravenous contrast.

[Series 2: head wo · axial · 0.41mm/px · z∈[-183,-63]mm · 12 of 30 slices shown, 15 images]
[im 3/30  brain]
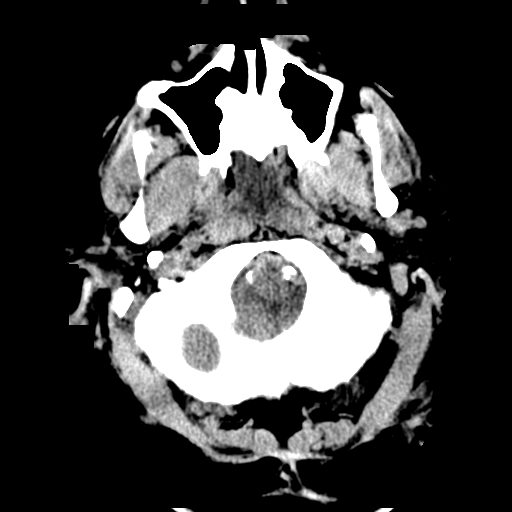
[im 3/30  bone]
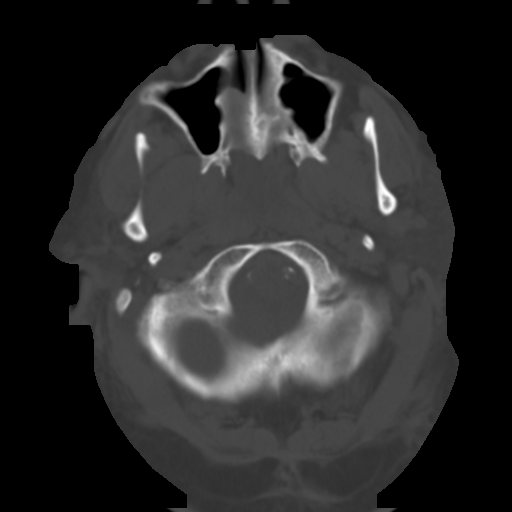
[im 5/30  brain]
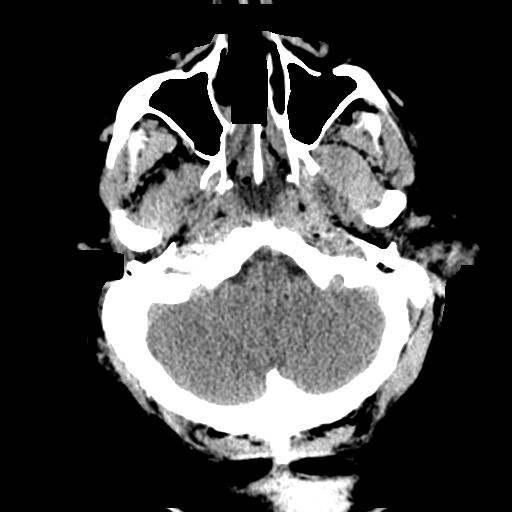
[im 7/30  brain]
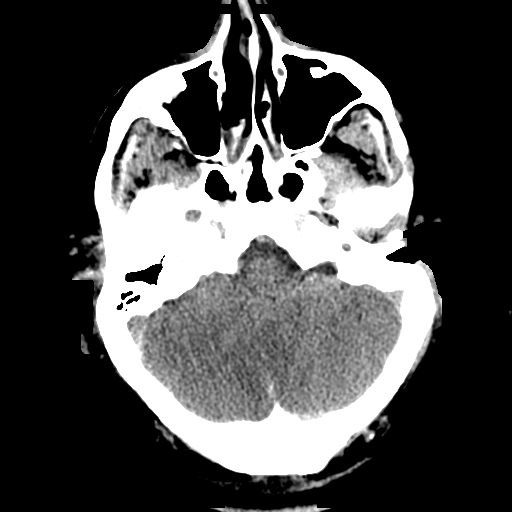
[im 9/30  brain]
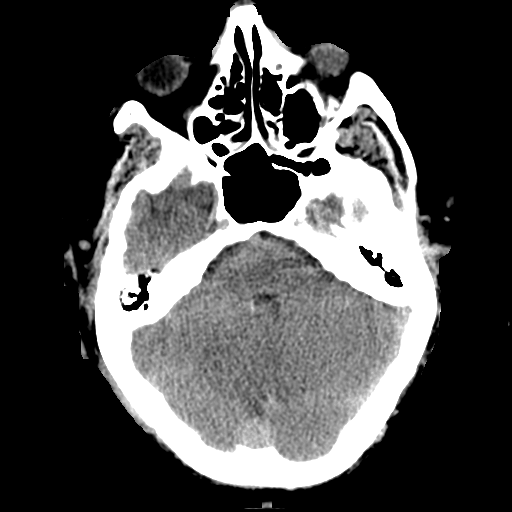
[im 11/30  brain]
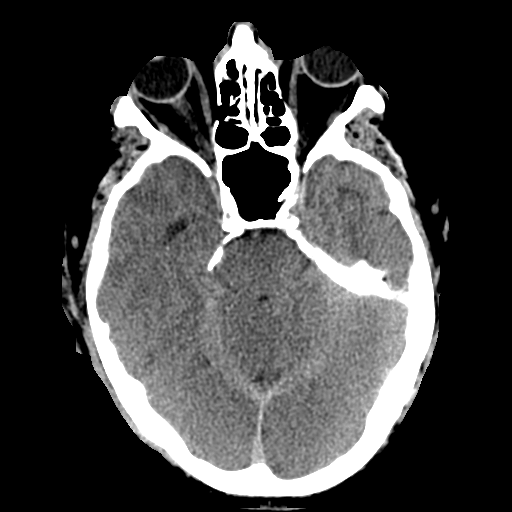
[im 11/30  bone]
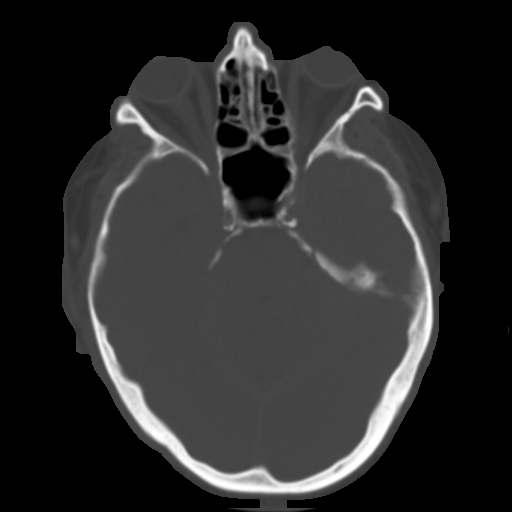
[im 13/30  brain]
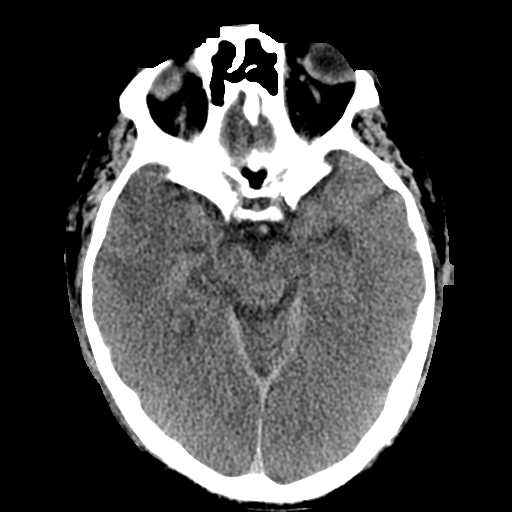
[im 17/30  brain]
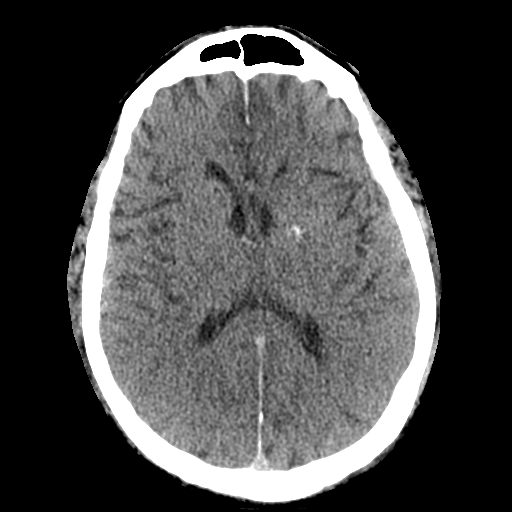
[im 19/30  brain]
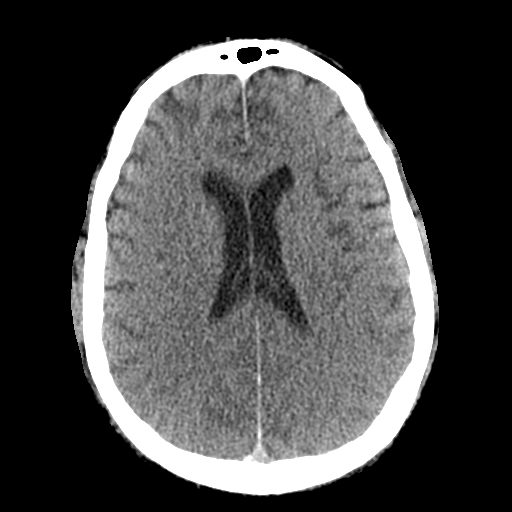
[im 21/30  brain]
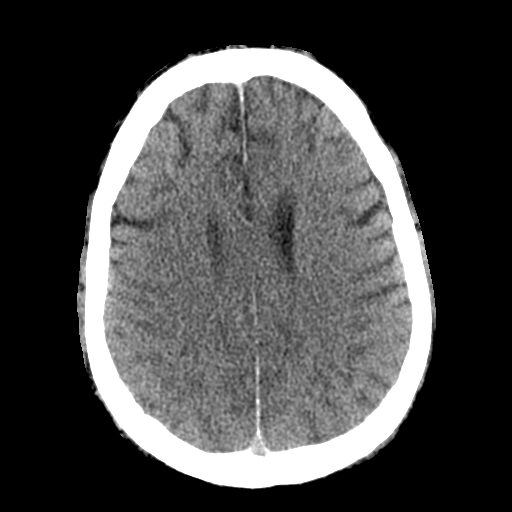
[im 21/30  bone]
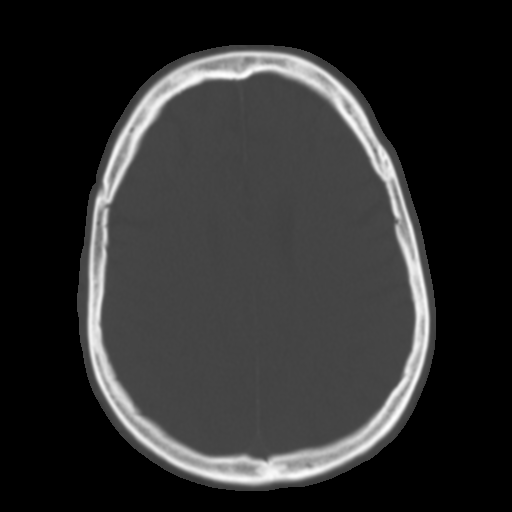
[im 23/30  brain]
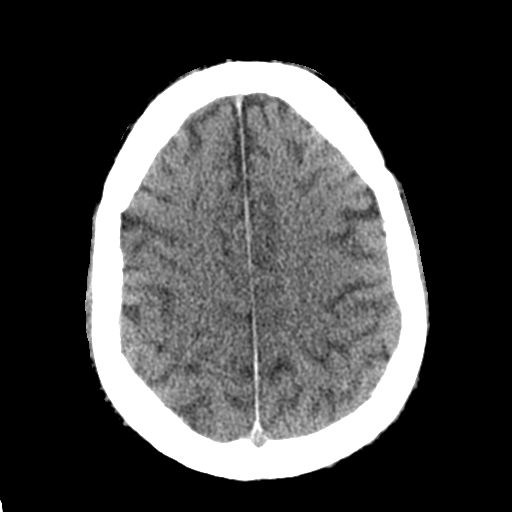
[im 25/30  brain]
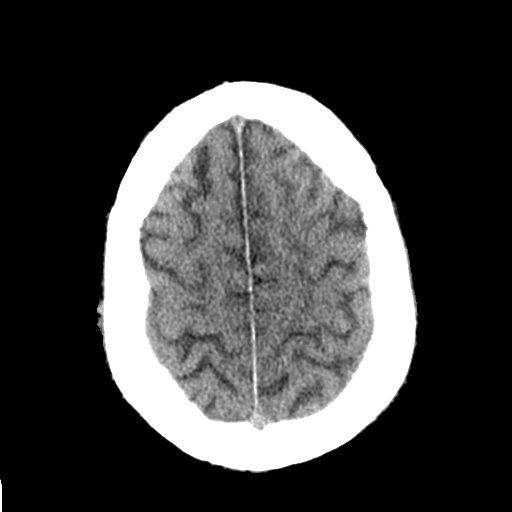
[im 27/30  brain]
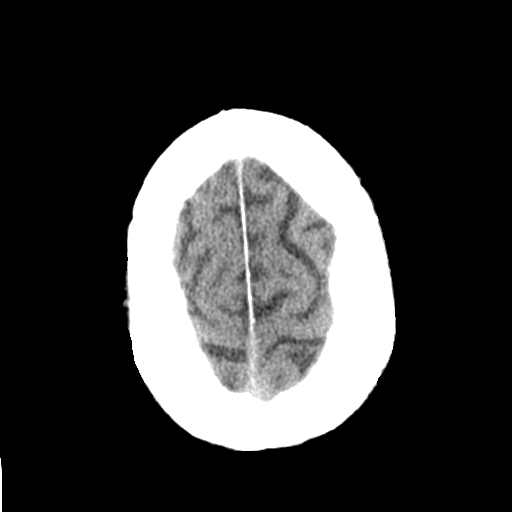

[Series 7: sagittal soft tissue · sagittal · 0.34mm/px · 3 of 57 slices shown]
[im 19/57  brain]
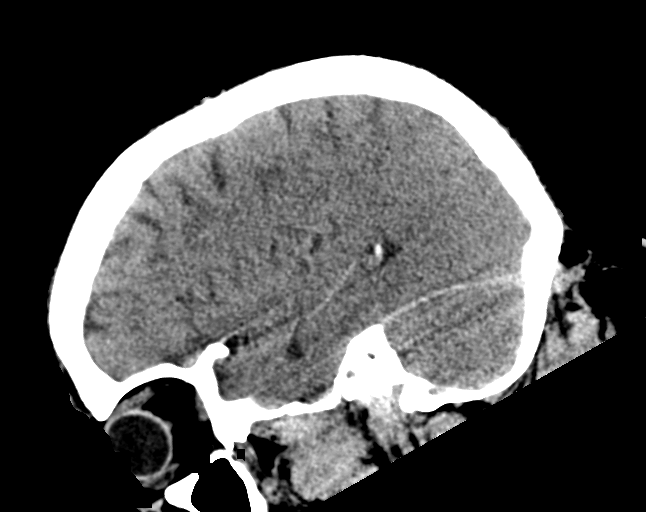
[im 29/57  brain]
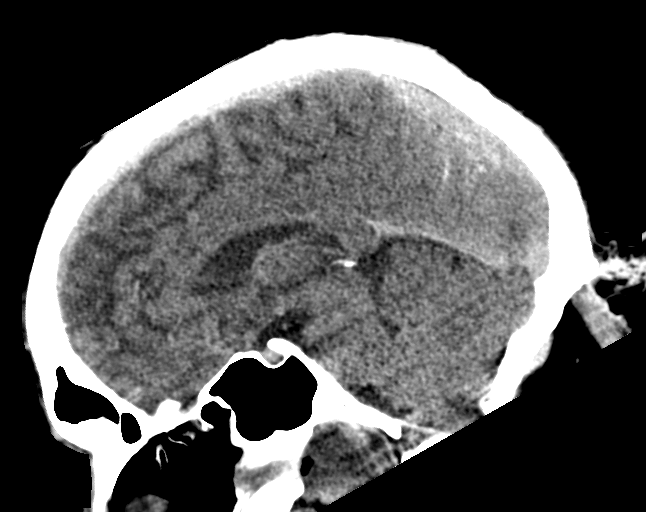
[im 38/57  brain]
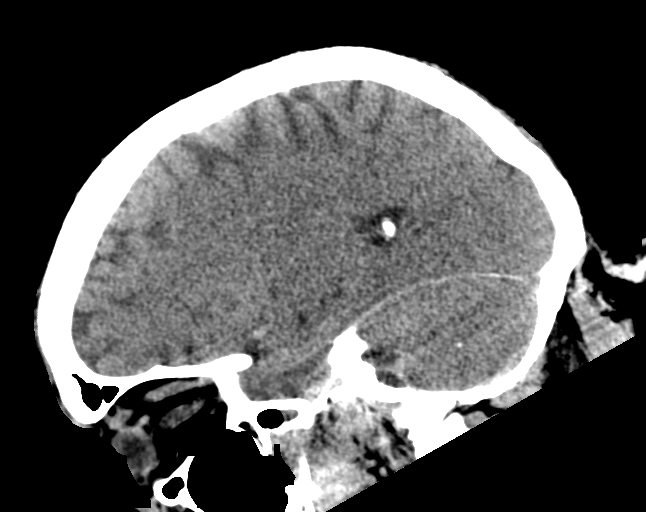

[15 of 37 positions shown; findings below may reference images not displayed]

FINDINGS: Brain: No evidence of acute infarction, hemorrhage, hydrocephalus,
extra-axial collection or mass lesion/mass effect.

Vascular: Calcifications are present in both cavernous carotid
arteries.

Skull: Normal. Negative for fracture or focal lesion.

Sinuses/Orbits: No acute finding.

Other: None.
IMPRESSION: No acute intracranial abnormality. Specifically, no evidence of
acute intracranial hemorrhage.
